# Patient Record
Sex: Female | Born: 1975 | Race: White | Hispanic: No | Marital: Married | Smoking: Former smoker
Health system: Southern US, Community
[De-identification: ages and names within clinical notes are randomized; demographics above are authoritative.]

## PROBLEM LIST (undated history)

## (undated) DIAGNOSIS — M329 Systemic lupus erythematosus, unspecified: Secondary | ICD-10-CM

## (undated) DIAGNOSIS — T8859XA Other complications of anesthesia, initial encounter: Secondary | ICD-10-CM

## (undated) DIAGNOSIS — J301 Allergic rhinitis due to pollen: Secondary | ICD-10-CM

## (undated) DIAGNOSIS — I7300 Raynaud's syndrome without gangrene: Secondary | ICD-10-CM

## (undated) DIAGNOSIS — F329 Major depressive disorder, single episode, unspecified: Secondary | ICD-10-CM

## (undated) DIAGNOSIS — F32A Depression, unspecified: Secondary | ICD-10-CM

## (undated) DIAGNOSIS — B019 Varicella without complication: Secondary | ICD-10-CM

## (undated) DIAGNOSIS — IMO0002 Reserved for concepts with insufficient information to code with codable children: Secondary | ICD-10-CM

## (undated) DIAGNOSIS — F419 Anxiety disorder, unspecified: Secondary | ICD-10-CM

## (undated) DIAGNOSIS — T4145XA Adverse effect of unspecified anesthetic, initial encounter: Secondary | ICD-10-CM

## (undated) HISTORY — DX: Anxiety disorder, unspecified: F41.9

## (undated) HISTORY — DX: Major depressive disorder, single episode, unspecified: F32.9

## (undated) HISTORY — PX: WISDOM TOOTH EXTRACTION: SHX21

## (undated) HISTORY — DX: Varicella without complication: B01.9

## (undated) HISTORY — DX: Allergic rhinitis due to pollen: J30.1

## (undated) HISTORY — DX: Depression, unspecified: F32.A

---
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
## 1999-02-01 HISTORY — PX: APPENDECTOMY: SHX54

## 2006-09-01 ENCOUNTER — Emergency Department (HOSPITAL_COMMUNITY): Admission: EM | Admit: 2006-09-01 | Discharge: 2006-09-01 | Payer: Self-pay | Admitting: Emergency Medicine

## 2012-10-25 ENCOUNTER — Encounter: Payer: Self-pay | Admitting: Internal Medicine

## 2012-10-25 ENCOUNTER — Ambulatory Visit (INDEPENDENT_AMBULATORY_CARE_PROVIDER_SITE_OTHER): Payer: 59 | Admitting: Internal Medicine

## 2012-10-25 VITALS — BP 118/74 | HR 78 | Temp 98.5°F | Wt 237.0 lb

## 2012-10-25 DIAGNOSIS — F341 Dysthymic disorder: Secondary | ICD-10-CM

## 2012-10-25 DIAGNOSIS — F329 Major depressive disorder, single episode, unspecified: Secondary | ICD-10-CM | POA: Insufficient documentation

## 2012-10-25 DIAGNOSIS — R062 Wheezing: Secondary | ICD-10-CM

## 2012-10-25 DIAGNOSIS — R05 Cough: Secondary | ICD-10-CM

## 2012-10-25 MED ORDER — HYDROCODONE-HOMATROPINE 5-1.5 MG/5ML PO SYRP: 5.0000 mL | ORAL_SOLUTION | Freq: Three times a day (TID) | ORAL | Status: DC | PRN

## 2012-10-25 MED ORDER — ESCITALOPRAM OXALATE 10 MG PO TABS: 10.0000 mg | ORAL_TABLET | Freq: Every day | ORAL | Status: DC

## 2012-10-25 MED ORDER — ALBUTEROL SULFATE HFA 108 (90 BASE) MCG/ACT IN AERS: 2.0000 | INHALATION_SPRAY | Freq: Four times a day (QID) | RESPIRATORY_TRACT | Status: DC | PRN

## 2012-10-25 MED ORDER — ALPRAZOLAM 0.25 MG PO TABS: 0.2500 mg | ORAL_TABLET | Freq: Two times a day (BID) | ORAL | Status: DC | PRN

## 2012-10-25 NOTE — Progress Notes (Signed)
Subjective:    Patient ID: Emily Li, female    DOB: 1975-02-12, 37 y.o.   MRN: [id number]  HPI  Pt presents to the clinic today to with c/o difficulty breathing and cough. This occurs mostly at night when she tries to lay down. The cough is unproductive. She denies history of allergies or asthma. She denies fever, chills or body aches. She has not used anything OTC. She has had sick contacts. She was a former smoker, quit 2 months ago. Additionally she c/o intermittent depression and anxiety. This has been a issue for in the past. It is socially related. She is having issues with her partner. She has been on Lexapro and supplemental Xanax in the past which seemed to work well for her. She does not have an appt with her new PCP Dr. Felicity Coyer until March 2015. She has been seeing a counselor who recommends that she get back on her medication. Review of Systems      No past medical history on file.  No current outpatient prescriptions on file.   No current facility-administered medications for this visit.    Not on File  No family history on file.  History   Social History  . Marital Status: Unknown    Spouse Name: N/A    Number of Children: N/A  . Years of Education: N/A   Occupational History  . Not on file.   Social History Main Topics  . Smoking status: Not on file  . Smokeless tobacco: Not on file  . Alcohol Use: Not on file  . Drug Use: Not on file  . Sexual Activity: Not on file   Other Topics Concern  . Not on file   Social History Narrative  . No narrative on file     Constitutional: Denies fever, malaise, fatigue, headache or abrupt weight changes.  HEENT: Denies eye pain, eye redness, ear pain, ringing in the ears, wax buildup, runny nose, nasal congestion, bloody nose, or sore throat. Respiratory: Pt reports cough and difficulty breathing. Denies shortness of breath, or sputum production.   Cardiovascular: Denies chest pain, chest tightness,  palpitations or swelling in the hands or feet.  Psych: Pt reports anxiety and depression. Denies SI/HI.  No other specific complaints in a complete review of systems (except as listed in HPI above).  Objective:   Physical Exam   BP 118/74  Pulse 78  Temp(Src) 98.5 F (36.9 C) (Oral)  Wt 237 lb (107.502 kg)  SpO2 98% Wt Readings from Last 3 Encounters:  10/25/12 237 lb (107.502 kg)    General: Appears her stated age, well developed, well nourished in NAD. HEENT: Head: normal shape and size; Eyes: sclera white, no icterus, conjunctiva pink, PERRLA and EOMs intact; Ears: Tm's gray and intact, normal light reflex; Nose: mucosa pink and moist, septum midline; Throat/Mouth: Teeth present, mucosa pink and moist, no exudate, lesions or ulcerations noted.  Neck: Tonsillar lymphadenopathy noted on right side. Normal range of motion. Neck supple, trachea midline. No thyromegaly present.  Cardiovascular: Normal rate and rhythm. S1,S2 noted.  No murmur, rubs or gallops noted. No JVD or BLE edema. No carotid bruits noted. Pulmonary/Chest: Normal effort and expiratory wheeze noted in RLL. No respiratory distress. No  rales or ronchi noted.  Psychiatric: Mood anxious and affect normal. Behavior is normal. Judgment and thought content normal.         Assessment & Plan:   URI, likely viral:  Supportive measures Tylenol as needed ERx Hycodan for  cough at night only eRx for albuterol for wheezing  RTC as needed or if symptoms persist or worsen

## 2012-10-25 NOTE — Patient Instructions (Signed)

## 2012-10-25 NOTE — Assessment & Plan Note (Signed)
Continue working with your therapist [facility] given eRx for Lexapro 10 mg daily eRx for xanax 0.25 mg BID prn anxiety  RTC in 1 month to assess medication effectiveness

## 2013-02-14 ENCOUNTER — Other Ambulatory Visit: Payer: Self-pay | Admitting: Internal Medicine

## 2013-02-14 NOTE — Telephone Encounter (Signed)
Last filled 10/25/12 w/ 2 refills-- pt has an appt scheduled with Dr Felicity CoyerLeschber 3/15--please advise

## 2013-02-21 ENCOUNTER — Other Ambulatory Visit: Payer: Self-pay | Admitting: Internal Medicine

## 2013-02-21 NOTE — Telephone Encounter (Signed)
Lm on pts vm informing her Rx has been called in to requested pharmacy 

## 2013-02-21 NOTE — Telephone Encounter (Signed)
Please go ahead and phone in xanax

## 2013-02-21 NOTE — Telephone Encounter (Signed)
Last filled 9/14--but pt has an upcoming appt with Dr Rosana FretLeschber--please advise

## 2013-03-19 LAB — HM PAP SMEAR: HM PAP: NORMAL

## 2013-03-27 ENCOUNTER — Encounter: Payer: Self-pay | Admitting: Internal Medicine

## 2013-03-27 ENCOUNTER — Ambulatory Visit (INDEPENDENT_AMBULATORY_CARE_PROVIDER_SITE_OTHER): Payer: 59 | Admitting: Internal Medicine

## 2013-03-27 VITALS — BP 118/72 | HR 74 | Temp 98.3°F | Ht 66.5 in | Wt 240.0 lb

## 2013-03-27 DIAGNOSIS — F329 Major depressive disorder, single episode, unspecified: Secondary | ICD-10-CM

## 2013-03-27 DIAGNOSIS — E669 Obesity, unspecified: Secondary | ICD-10-CM

## 2013-03-27 DIAGNOSIS — F419 Anxiety disorder, unspecified: Secondary | ICD-10-CM

## 2013-03-27 DIAGNOSIS — F341 Dysthymic disorder: Secondary | ICD-10-CM

## 2013-03-27 DIAGNOSIS — E66811 Obesity, class 1: Secondary | ICD-10-CM | POA: Insufficient documentation

## 2013-03-27 DIAGNOSIS — Z0000 Encounter for general adult medical examination without abnormal findings: Secondary | ICD-10-CM

## 2013-03-27 NOTE — Progress Notes (Signed)
Subjective:    Patient ID: Emily Li, female    DOB: 12-25-1975, 38 y.o.   MRN: [id number]  HPI  New patient to me, here to establish care - OV with NP fall 2014 reviewed  patient is here today for annual physical. Patient feels well overall.  Also reviewed chronic medical issues and interval medical events: anxiety/depression, obesity  Past Medical History  Diagnosis Date  . Chicken pox   . Hay fever   . Anxiety   . Anxiety and depression    Family History  Problem Relation Age of Onset  . Alcohol abuse Mother   . Hyperlipidemia Mother   . Diabetes Mother   . Alcohol abuse Father   . Cancer Father   . Hyperlipidemia Father   . Diabetes Father   . Arthritis Maternal Grandmother   . Heart disease Maternal Grandmother   . Stroke Maternal Grandmother   . Hypertension Maternal Grandmother   . Arthritis Maternal Grandfather   . Heart disease Maternal Grandfather   . Stroke Maternal Grandfather   . Hypertension Maternal Grandfather   . Cancer Maternal Aunt 54    vaginal ca   History  Substance Use Topics  . Smoking status: Never Smoker   . Smokeless tobacco: Not on file  . Alcohol Use: Yes    Review of Systems  Constitutional: Negative for fatigue and unexpected weight change.  Respiratory: Negative for cough, shortness of breath and wheezing.   Cardiovascular: Negative for chest pain, palpitations and leg swelling.  Gastrointestinal: Negative for nausea, abdominal pain and diarrhea.  Neurological: Negative for dizziness, weakness, light-headedness and headaches.  Psychiatric/Behavioral: Negative for dysphoric mood. The patient is not nervous/anxious.   All other systems reviewed and are negative.       Objective:   Physical Exam  BP 118/72  Pulse 74  Temp(Src) 98.3 F (36.8 C) (Oral)  Ht 5' 6.5" (1.689 m)  Wt 240 lb (108.863 kg)  BMI 38.16 kg/m2  SpO2 98% Wt Readings from Last 3 Encounters:  03/27/13 240 lb (108.863 kg)  10/25/12 237 lb  (107.502 kg)   Constitutional: She is overweight, but appears well-developed and well-nourished. No distress.  HENT: Head: Normocephalic and atraumatic. Ears: B TMs ok, no erythema or effusion; Nose: Nose normal. Mouth/Throat: Oropharynx is clear and moist. No oropharyngeal exudate.  Eyes: Conjunctivae and EOM are normal. Pupils are equal, round, and reactive to light. No scleral icterus.  Neck: Normal range of motion. Neck supple. No JVD present. No thyromegaly present.  Cardiovascular: Normal rate, regular rhythm and normal heart sounds.  No murmur heard. No BLE edema. Pulmonary/Chest: Effort normal and breath sounds normal. No respiratory distress. She has no wheezes.  Abdominal: Soft. Bowel sounds are normal. She exhibits no distension. There is no tenderness. no masses Musculoskeletal: Normal range of motion, no joint effusions. No gross deformities Neurological: She is alert and oriented to person, place, and time. No cranial nerve deficit. Coordination, balance, strength, speech and gait are normal.  Skin: Skin is warm and dry. No rash noted. No erythema.  Psychiatric: She has a normal mood and affect. Her behavior is normal. Judgment and thought content normal.     No results found for this basename: WBC,  HGB,  HCT,  PLT,  GLUCOSE,  CHOL,  TRIG,  HDL,  LDLDIRECT,  LDLCALC,  ALT,  AST,  NA,  K,  CL,  CREATININE,  BUN,  CO2,  TSH,  PSA,  INR,  GLUF,  HGBA1C,  MICROALBUR    No results found.     Assessment & Plan:   CPX/v70.0 - Patient has been counseled on age-appropriate routine health concerns for screening and prevention. These are reviewed and up-to-date. Immunizations are up-to-date or declined. Labs ordered/ reviewed.  Problem List Items Addressed This Visit   Anxiety and depression     symptoms stable on current lexapro and prn xanax The current medical regimen is effective;  continue present plan and medications.     Obese      Wt Readings from Last 3 Encounters:    03/27/13 240 lb (108.863 kg)  10/25/12 237 lb (107.502 kg)   The patient is asked to make an attempt to improve diet and exercise patterns to aid in medical management of this problem. Continue working with [employer] as ongoing since 2013     Other Visit Diagnoses   Routine general medical examination at a health care facility    -  Primary    Relevant Orders       Basic metabolic panel       CBC with Differential       Hepatic function panel       Lipid panel       TSH       Urinalysis, Routine w reflex microscopic

## 2013-03-27 NOTE — Patient Instructions (Addendum)
It was good to see you today.  We have reviewed your prior records including labs and tests today  Health Maintenance reviewed - all recommended immunizations and age-appropriate screenings are up-to-date.  Test(s) ordered today. Your results will be released to [facility] (or called to you) after review, usually within 72hours after test completion. If any changes need to be made, you will be notified at that same time.  Medications reviewed and updated, no changes recommended at this time.  Please schedule followup in 12 months for annual exam and labs, call sooner if problems.  Health Maintenance, Female A healthy lifestyle and preventative care can promote health and wellness.  Maintain regular health, dental, and eye exams.  Eat a healthy diet. Foods like vegetables, fruits, whole grains, low-fat dairy products, and lean protein foods contain the nutrients you need without too many calories. Decrease your intake of foods high in solid fats, added sugars, and salt. Get information about a proper diet from your caregiver, if necessary.  Regular physical exercise is one of the most important things you can do for your health. Most adults should get at least 150 minutes of moderate-intensity exercise (any activity that increases your heart rate and causes you to sweat) each week. In addition, most adults need muscle-strengthening exercises on 2 or more days a week.   Maintain a healthy weight. The body mass index (BMI) is a screening tool to identify possible weight problems. It provides an estimate of body fat based on height and weight. Your caregiver can help determine your BMI, and can help you achieve or maintain a healthy weight. For adults 20 years and older:  A BMI below 18.5 is considered underweight.  A BMI of 18.5 to 24.9 is normal.  A BMI of 25 to 29.9 is considered overweight.  A BMI of 30 and above is considered obese.  Maintain normal blood lipids and cholesterol by  exercising and minimizing your intake of saturated fat. Eat a balanced diet with plenty of fruits and vegetables. Blood tests for lipids and cholesterol should begin at age 96 and be repeated every 5 years. If your lipid or cholesterol levels are high, you are over 50, or you are a high risk for heart disease, you may need your cholesterol levels checked more frequently.Ongoing high lipid and cholesterol levels should be treated with medicines if diet and exercise are not effective.  If you smoke, find out from your caregiver how to quit. If you do not use tobacco, do not start.  Lung cancer screening is recommended for adults aged 32 80 years who are at high risk for developing lung cancer because of a history of smoking. Yearly low-dose computed tomography (CT) is recommended for people who have at least a 30-pack-year history of smoking and are a current smoker or have quit within the past 15 years. A pack year of smoking is smoking an average of 1 pack of cigarettes a day for 1 year (for example: 1 pack a day for 30 years or 2 packs a day for 15 years). Yearly screening should continue until the smoker has stopped smoking for at least 15 years. Yearly screening should also be stopped for people who develop a health problem that would prevent them from having lung cancer treatment.  If you are pregnant, do not drink alcohol. If you are breastfeeding, be very cautious about drinking alcohol. If you are not pregnant and choose to drink alcohol, do not exceed 1 drink per day. One drink  is considered to be 12 ounces (355 mL) of beer, 5 ounces (148 mL) of wine, or 1.5 ounces (44 mL) of liquor.  Avoid use of street drugs. Do not share needles with anyone. Ask for help if you need support or instructions about stopping the use of drugs.  High blood pressure causes heart disease and increases the risk of stroke. Blood pressure should be checked at least every 1 to 2 years. Ongoing high blood pressure should be  treated with medicines, if weight loss and exercise are not effective.  If you are 39 to 36 years old, ask your caregiver if you should take aspirin to prevent strokes.  Diabetes screening involves taking a blood sample to check your fasting blood sugar level. This should be done once every 3 years, after age 66, if you are within normal weight and without risk factors for diabetes. Testing should be considered at a younger age or be carried out more frequently if you are overweight and have at least 1 risk factor for diabetes.  Breast cancer screening is essential preventative care for women. You should practice "breast self-awareness." This means understanding the normal appearance and feel of your breasts and may include breast self-examination. Any changes detected, no matter how small, should be reported to a caregiver. Women in their 73s and 30s should have a clinical breast exam (CBE) by a caregiver as part of a regular health exam every 1 to 3 years. After age 39, women should have a CBE every year. Starting at age 91, women should consider having a mammogram (breast X-ray) every year. Women who have a family history of breast cancer should talk to their caregiver about genetic screening. Women at a high risk of breast cancer should talk to their caregiver about having an MRI and a mammogram every year.  Breast cancer gene (BRCA)-related cancer risk assessment is recommended for women who have family members with BRCA-related cancers. BRCA-related cancers include breast, ovarian, tubal, and peritoneal cancers. Having family members with these cancers may be associated with an increased risk for harmful changes (mutations) in the breast cancer genes BRCA1 and BRCA2. Results of the assessment will determine the need for genetic counseling and BRCA1 and BRCA2 testing.  The Pap test is a screening test for cervical cancer. Women should have a Pap test starting at age 63. Between ages 85 and 56, Pap  tests should be repeated every 2 years. Beginning at age 29, you should have a Pap test every 3 years as long as the past 3 Pap tests have been normal. If you had a hysterectomy for a problem that was not cancer or a condition that could lead to cancer, then you no longer need Pap tests. If you are between ages 49 and 47, and you have had normal Pap tests going back 10 years, you no longer need Pap tests. If you have had past treatment for cervical cancer or a condition that could lead to cancer, you need Pap tests and screening for cancer for at least 20 years after your treatment. If Pap tests have been discontinued, risk factors (such as a new sexual partner) need to be reassessed to determine if screening should be resumed. Some women have medical problems that increase the chance of getting cervical cancer. In these cases, your caregiver may recommend more frequent screening and Pap tests.  The human papillomavirus (HPV) test is an additional test that may be used for cervical cancer screening. The HPV test looks for  the virus that can cause the cell changes on the cervix. The cells collected during the Pap test can be tested for HPV. The HPV test could be used to screen women aged 31 years and older, and should be used in women of any age who have unclear Pap test results. After the age of 35, women should have HPV testing at the same frequency as a Pap test.  Colorectal cancer can be detected and often prevented. Most routine colorectal cancer screening begins at the age of 76 and continues through age 69. However, your caregiver may recommend screening at an earlier age if you have risk factors for colon cancer. On a yearly basis, your caregiver may provide home test kits to check for hidden blood in the stool. Use of a small camera at the end of a tube, to directly examine the colon (sigmoidoscopy or colonoscopy), can detect the earliest forms of colorectal cancer. Talk to your caregiver about this at  age 68, when routine screening begins. Direct examination of the colon should be repeated every 5 to 10 years through age 84, unless early forms of pre-cancerous polyps or small growths are found.  Hepatitis C blood testing is recommended for all people born from [birth year] through 1963 and any individual with known risks for hepatitis C.  Practice safe sex. Use condoms and avoid high-risk sexual practices to reduce the spread of sexually transmitted infections (STIs). Sexually active women aged 66 and younger should be checked for Chlamydia, which is a common sexually transmitted infection. Older women with new or multiple partners should also be tested for Chlamydia. Testing for other STIs is recommended if you are sexually active and at increased risk.  Osteoporosis is a disease in which the bones lose minerals and strength with aging. This can result in serious bone fractures. The risk of osteoporosis can be identified using a bone density scan. Women ages 65 and over and women at risk for fractures or osteoporosis should discuss screening with their caregivers. Ask your caregiver whether you should be taking a calcium supplement or vitamin D to reduce the rate of osteoporosis.  Menopause can be associated with physical symptoms and risks. Hormone replacement therapy is available to decrease symptoms and risks. You should talk to your caregiver about whether hormone replacement therapy is right for you.  Use sunscreen. Apply sunscreen liberally and repeatedly throughout the day. You should seek shade when your shadow is shorter than you. Protect yourself by wearing long sleeves, pants, a wide-brimmed hat, and sunglasses year round, whenever you are outdoors.  Notify your caregiver of new moles or changes in moles, especially if there is a change in shape or color. Also notify your caregiver if a mole is larger than the size of a pencil eraser.  Stay current with your immunizations. Document Released:  08/02/2010 Document Revised: 05/14/2012 Document Reviewed: 08/02/2010 [facility] Patient Information 2014 [place].

## 2013-03-27 NOTE — Progress Notes (Signed)
Pre-visit discussion using our clinic review tool. No additional management support is needed unless otherwise documented below in the visit note.  

## 2013-03-27 NOTE — Assessment & Plan Note (Signed)
Wt Readings from Last 3 Encounters:  03/27/13 240 lb (108.863 kg)  10/25/12 237 lb (107.502 kg)   The patient is asked to make an attempt to improve diet and exercise patterns to aid in medical management of this problem. Continue working with [employer] as ongoing since 2013

## 2013-03-27 NOTE — Assessment & Plan Note (Signed)
symptoms stable on current lexapro and prn xanax The current medical regimen is effective;  continue present plan and medications.

## 2013-04-02 ENCOUNTER — Ambulatory Visit: Payer: Self-pay | Admitting: Internal Medicine

## 2013-07-08 ENCOUNTER — Other Ambulatory Visit: Payer: Self-pay | Admitting: Internal Medicine

## 2013-07-08 NOTE — Telephone Encounter (Signed)
Pt had a

## 2013-07-08 NOTE — Telephone Encounter (Signed)
Pt had a CPE appointment with you on 03/27/13--please advise Rx last filled by Nicki Reaper on 02/14/13 with 2 refills

## 2014-01-14 ENCOUNTER — Other Ambulatory Visit: Payer: Self-pay | Admitting: Internal Medicine

## 2014-01-20 ENCOUNTER — Encounter (HOSPITAL_COMMUNITY): Payer: Self-pay | Admitting: Emergency Medicine

## 2014-01-20 ENCOUNTER — Emergency Department (HOSPITAL_COMMUNITY)
Admission: EM | Admit: 2014-01-20 | Discharge: 2014-01-20 | Disposition: A | Discharge status: Home / Self Care | Payer: 59 | Source: Home / Self Care | Attending: Emergency Medicine | Admitting: Emergency Medicine

## 2014-01-20 DIAGNOSIS — J039 Acute tonsillitis, unspecified: Secondary | ICD-10-CM

## 2014-01-20 MED ORDER — ONDANSETRON 8 MG PO TBDP: 8.0000 mg | ORAL_TABLET | Freq: Three times a day (TID) | ORAL | Status: DC | PRN

## 2014-01-20 MED ORDER — AMOXICILLIN 500 MG PO CAPS: 1000.0000 mg | ORAL_CAPSULE | Freq: Three times a day (TID) | ORAL | Status: DC

## 2014-01-20 MED ORDER — PREDNISONE 50 MG PO TABS: 50.0000 mg | ORAL_TABLET | Freq: Every day | ORAL | Status: DC

## 2014-01-20 NOTE — ED Provider Notes (Signed)
CSN: [id number]     Arrival date & time 01/20/14  1100 History   First MD Initiated Contact with Patient 01/20/14 1144     Chief Complaint  Patient presents with  . Sore Throat  . Fever  . Emesis   (Consider location/radiation/quality/duration/timing/severity/associated sxs/prior Treatment) HPI           38 year old female presents complaining of sore throat. For 2 days she has had sore throat, vomiting, fever to 104F, and body aches. She has multiple sick contacts, she works in the pharmacy. No recent travel. Over-the-counter medications are not helping her symptoms.    Past Medical History  Diagnosis Date  . Chicken pox   . Hay fever   . Anxiety   . Anxiety and depression    Past Surgical History  Procedure Laterality Date  . Appendectomy  2001  . Wisdom tooth extraction     Family History  Problem Relation Age of Onset  . Alcohol abuse Mother   . Hyperlipidemia Mother   . Diabetes Mother   . Alcohol abuse Father   . Cancer Father   . Hyperlipidemia Father   . Diabetes Father   . Arthritis Maternal Grandmother   . Heart disease Maternal Grandmother   . Stroke Maternal Grandmother   . Hypertension Maternal Grandmother   . Arthritis Maternal Grandfather   . Heart disease Maternal Grandfather   . Stroke Maternal Grandfather   . Hypertension Maternal Grandfather   . Cancer Maternal Aunt 54    vaginal ca   History  Substance Use Topics  . Smoking status: Never Smoker   . Smokeless tobacco: Not on file  . Alcohol Use: Yes   OB History    No data available     Review of Systems  Constitutional: Positive for fever and chills.  HENT: Positive for sore throat. Negative for congestion and ear pain.   Respiratory: Negative for cough.   Gastrointestinal: Positive for nausea and vomiting. Negative for abdominal pain and diarrhea.  Musculoskeletal: Positive for myalgias and arthralgias.  All other systems reviewed and are negative.   Allergies  Anectine and  Latex  Home Medications   Prior to Admission medications   Medication Sig Start Date End Date Taking? Authorizing Provider  ALPRAZolam Prudy Feeler(XANAX) 0.25 MG tablet TAKE 1 TABLET BY MOUTH TWICE DAILY AS NEEDED 02/21/13   Lorre Munroeegina W Baity, NP  amoxicillin (AMOXIL) 500 MG capsule Take 2 capsules (1,000 mg total) by mouth 3 (three) times daily. 01/20/14   Graylon GoodZachary H Amador Braddy, PA-C  escitalopram (LEXAPRO) 10 MG tablet TAKE 1 TABLET BY MOUTH ONCE DAILY 01/14/14   Newt LukesValerie A Leschber, MD  ondansetron (ZOFRAN ODT) 8 MG disintegrating tablet Take 1 tablet (8 mg total) by mouth every 8 (eight) hours as needed for nausea or vomiting. 01/20/14   Graylon GoodZachary H Sedona Wenk, PA-C  predniSONE (DELTASONE) 50 MG tablet Take 1 tablet (50 mg total) by mouth daily with breakfast. 01/20/14   Graylon GoodZachary H Marrietta Thunder, PA-C   BP 125/83 mmHg  Pulse 108  Temp(Src) 100.8 F (38.2 C) (Oral)  SpO2 97%  LMP 12/30/2013 (Approximate) Physical Exam  Constitutional: She is oriented to person, place, and time. Vital signs are normal. She appears well-developed and well-nourished. No distress.  HENT:  Head: Normocephalic and atraumatic.  Right Ear: External ear normal.  Left Ear: External ear normal.  Nose: Nose normal.  Mouth/Throat: Uvula is midline and mucous membranes are normal. Oropharyngeal exudate and posterior oropharyngeal erythema present. No posterior oropharyngeal edema or  tonsillar abscesses.  3+ tonsillar enlargement with erythema and exudate bilaterally, symmetrical enlargement  Eyes: Conjunctivae are normal. Right eye exhibits no discharge. Left eye exhibits no discharge.  Neck: Normal range of motion. Neck supple.  Cardiovascular: Tachycardia present.   Pulmonary/Chest: Effort normal. No respiratory distress.  Lymphadenopathy:       Head (right side): Tonsillar adenopathy present.       Head (left side): Tonsillar adenopathy present.    She has cervical adenopathy.       Right cervical: Posterior cervical adenopathy present.        Left cervical: Posterior cervical adenopathy present.  Neurological: She is alert and oriented to person, place, and time. She has normal strength. Coordination normal.  Skin: Skin is warm and dry. No rash noted. She is not diaphoretic.  Psychiatric: She has a normal mood and affect. Judgment normal.  Nursing note and vitals reviewed.   ED Course  Procedures (including critical care time) Labs Review Labs Reviewed - No data to display  Imaging Review No results found.   MDM   1. Exudative tonsillitis    Consistent with strep tonsillitis. Treat with amoxicillin and symptomatic management. Follow-up when necessary if no improvement in a few days   Meds ordered this encounter  Medications  . amoxicillin (AMOXIL) 500 MG capsule    Sig: Take 2 capsules (1,000 mg total) by mouth 3 (three) times daily.    Dispense:  42 capsule    Refill:  0  . ondansetron (ZOFRAN ODT) 8 MG disintegrating tablet    Sig: Take 1 tablet (8 mg total) by mouth every 8 (eight) hours as needed for nausea or vomiting.    Dispense:  12 tablet    Refill:  0  . predniSONE (DELTASONE) 50 MG tablet    Sig: Take 1 tablet (50 mg total) by mouth daily with breakfast.    Dispense:  5 tablet    Refill:  0       Graylon GoodZachary H Olamae Ferrara, PA-C 01/20/14 1147

## 2014-01-20 NOTE — Discharge Instructions (Signed)

## 2014-01-20 NOTE — ED Notes (Signed)
Pt started with vomiting, fever, and body aches yesterday.  She developed a fever of 104 last night.  She also complains of a sore throat.

## 2014-08-07 ENCOUNTER — Ambulatory Visit (INDEPENDENT_AMBULATORY_CARE_PROVIDER_SITE_OTHER): Payer: 59 | Admitting: Internal Medicine

## 2014-08-07 ENCOUNTER — Other Ambulatory Visit (INDEPENDENT_AMBULATORY_CARE_PROVIDER_SITE_OTHER): Payer: 59

## 2014-08-07 ENCOUNTER — Encounter: Payer: Self-pay | Admitting: Internal Medicine

## 2014-08-07 VITALS — BP 108/78 | HR 81 | Temp 97.8°F | Ht 66.5 in | Wt 256.5 lb

## 2014-08-07 DIAGNOSIS — Z0000 Encounter for general adult medical examination without abnormal findings: Secondary | ICD-10-CM

## 2014-08-07 LAB — COMPREHENSIVE METABOLIC PANEL
ALT: 11 U/L (ref 0–35)
AST: 12 U/L (ref 0–37)
Albumin: 3.5 g/dL (ref 3.5–5.2)
Alkaline Phosphatase: 44 U/L (ref 39–117)
BUN: 9 mg/dL (ref 6–23)
CHLORIDE: 108 meq/L (ref 96–112)
CO2: 27 meq/L (ref 19–32)
Calcium: 9.1 mg/dL (ref 8.4–10.5)
Creatinine, Ser: 0.78 mg/dL (ref 0.40–1.20)
GFR: 88.44 mL/min (ref >60.00)
Glucose, Bld: 87 mg/dL (ref 70–99)
Potassium: 4.4 mEq/L (ref 3.5–5.1)
Sodium: 141 mEq/L (ref 135–145)
TOTAL PROTEIN: 6.6 g/dL (ref 6.0–8.3)
Total Bilirubin: 0.4 mg/dL (ref 0.2–1.2)

## 2014-08-07 LAB — LIPID PANEL
CHOLESTEROL: 239 mg/dL — AB (ref 0–200)
HDL: 59.2 mg/dL (ref >39.00)
LDL Cholesterol: 142 mg/dL — ABNORMAL HIGH (ref 0–99)
NonHDL: 179.8
Total CHOL/HDL Ratio: 4
Triglycerides: 189 mg/dL — ABNORMAL HIGH (ref 0.0–149.0)
VLDL: 37.8 mg/dL (ref 0.0–40.0)

## 2014-08-07 LAB — CBC
HCT: 42.2 % (ref 36.0–46.0)
Hemoglobin: 14.1 g/dL (ref 12.0–15.0)
MCHC: 33.3 g/dL (ref 30.0–36.0)
MCV: 91.3 fl (ref 78.0–100.0)
PLATELETS: 207 10*3/uL (ref 150.0–400.0)
RBC: 4.62 Mil/uL (ref 3.87–5.11)
RDW: 13.1 % (ref 11.5–15.5)
WBC: 6.7 10*3/uL (ref 4.0–10.5)

## 2014-08-07 LAB — HEPATITIS B SURFACE ANTIBODY,QUALITATIVE: Hep B S Ab: POSITIVE — AB

## 2014-08-07 LAB — HEMOGLOBIN A1C: HEMOGLOBIN A1C: 5.1 % (ref 4.6–6.5)

## 2014-08-07 LAB — TSH: TSH: 1.73 u[IU]/mL (ref 0.35–4.50)

## 2014-08-07 MED ORDER — ESCITALOPRAM OXALATE 10 MG PO TABS: 10.0000 mg | ORAL_TABLET | Freq: Every day | ORAL | Status: DC

## 2014-08-07 NOTE — Assessment & Plan Note (Signed)
Checking for complications with CMP, lipid panel, HgA1c. Is exercising and trying to lose weight. Feels her birth control is making her gain weight and advised her to talk to her Ob/gyn about that since she could not remember name of it today.

## 2014-08-07 NOTE — Assessment & Plan Note (Signed)
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 She needs TB skin test times 2. She will come early next week for the first then wait 2 weeks for next. If not immune will need series again since she does not have any of her shot dates. Checking routine labs. Is exercising and non-smoker. Talked to her about weight loss as a way to a healthier life. She is working on it.

## 2014-08-07 NOTE — Progress Notes (Signed)
   Subjective:    Patient ID: Emily Li, female    DOB: 04/15/1975, 39 y.o.   MRN: [id number]  HPI The patient is a 39 YO female coming in for wellness. Also needs for titers for school. No new complaints.   [facility] reviewed and updated.  Review of Systems  Constitutional: Negative for fever, activity change, appetite change, fatigue and unexpected weight change.  HENT: Negative.   Eyes: Negative.   Respiratory: Negative for cough, chest tightness, shortness of breath and wheezing.   Cardiovascular: Negative for chest pain, palpitations and leg swelling.  Gastrointestinal: Negative for nausea, abdominal pain, diarrhea and abdominal distention.  Musculoskeletal: Negative.   Skin: Negative.   Neurological: Negative.   Psychiatric/Behavioral: Negative.       Objective:   Physical Exam  Constitutional: She is oriented to person, place, and time. She appears well-developed and well-nourished.  Overweight  HENT:  Head: Normocephalic and atraumatic.  Eyes: EOM are normal.  Neck: Normal range of motion.  Cardiovascular: Normal rate and regular rhythm.   Pulmonary/Chest: Effort normal. No respiratory distress. She has no wheezes. She has no rales.  Abdominal: Soft. She exhibits no distension. There is no tenderness. There is no rebound.  Musculoskeletal: She exhibits no edema.  Neurological: She is alert and oriented to person, place, and time. Coordination normal.  Skin: Skin is warm and dry.  Psychiatric: She has a normal mood and affect.   Filed Vitals:   08/07/14 0851  BP: 108/78  Pulse: 81  Temp: 97.8 F (36.6 C)  TempSrc: Oral  Height: 5' 6.5" (1.689 m)  Weight: 256 lb 8 oz (116.348 kg)  SpO2: 97%      Assessment & Plan:

## 2014-08-07 NOTE — Progress Notes (Signed)
Pre visit review using our clinic review tool, if applicable. No additional management support is needed unless otherwise documented below in the visit note. 

## 2014-08-07 NOTE — Patient Instructions (Signed)
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 If any of these come back not immune you will need to undergo the vaccination series again.   We are also checking your basic labs to make sure you are still healthy.   We will keep your form here since you need to do 2 TB skin tests and that way we can attach a copy of the titers before I sign them.   I would strongly recommend keeping a copy of all your titers for the future.   Health Maintenance Adopting a healthy lifestyle and getting preventive care can go a long way to promote health and wellness. Talk with your health care provider about what schedule of regular examinations is right for you. This is a good chance for you to check in with your provider about disease prevention and staying healthy. In between checkups, there are plenty of things you can do on your own. Experts have done a lot of research about which lifestyle changes and preventive measures are most likely to keep you healthy. Ask your health care provider for more information. WEIGHT AND DIET  Eat a healthy diet  Be sure to include plenty of vegetables, fruits, low-fat dairy products, and lean protein.  Do not eat a lot of foods high in solid fats, added sugars, or salt.  Get regular exercise. This is one of the most important things you can do for your health.  Most adults should exercise for at least 150 minutes each week. The exercise should increase your heart rate and make you sweat (moderate-intensity exercise).  Most adults should also do strengthening exercises at least twice a week. This is in addition to the moderate-intensity exercise.  Maintain a healthy weight  Body mass index (BMI) is a measurement that can be used to identify possible weight problems. It estimates body fat based on height and weight. Your health care provider can help determine your BMI and help you achieve or maintain a healthy weight.  For females 71 years of age and older:   A  BMI below 18.5 is considered underweight.  A BMI of 18.5 to 24.9 is normal.  A BMI of 25 to 29.9 is considered overweight.  A BMI of 30 and above is considered obese.  Watch levels of cholesterol and blood lipids  You should start having your blood tested for lipids and cholesterol at 37 years of age, then have this test every 5 years.  You may need to have your cholesterol levels checked more often if:  Your lipid or cholesterol levels are high.  You are older than 37 years of age.  You are at high risk for heart disease.  CANCER SCREENING   Lung Cancer  Lung cancer screening is recommended for adults 27-52 years old who are at high risk for lung cancer because of a history of smoking.  A yearly low-dose CT scan of the lungs is recommended for people who:  Currently smoke.  Have quit within the past 15 years.  Have at least a 30-pack-year history of smoking. A pack year is smoking an average of one pack of cigarettes a day for 1 year.  Yearly screening should continue until it has been 15 years since you quit.  Yearly screening should stop if you develop a health problem that would prevent you from having lung cancer treatment.  Breast Cancer  Practice breast self-awareness. This means understanding how your breasts normally appear and feel.  It also means doing  regular breast self-exams. Let your health care provider know about any changes, no matter how small.  If you are in your 20s or 30s, you should have a clinical breast exam (CBE) by a health care provider every 1-3 years as part of a regular health exam.  If you are 11 or older, have a CBE every year. Also consider having a breast X-ray (mammogram) every year.  If you have a family history of breast cancer, talk to your health care provider about genetic screening.  If you are at high risk for breast cancer, talk to your health care provider about having an MRI and a mammogram every year.  Breast cancer  gene (BRCA) assessment is recommended for women who have family members with BRCA-related cancers. BRCA-related cancers include:  Breast.  Ovarian.  Tubal.  Peritoneal cancers.  Results of the assessment will determine the need for genetic counseling and BRCA1 and BRCA2 testing. Cervical Cancer Routine pelvic examinations to screen for cervical cancer are no longer recommended for nonpregnant women who are considered low risk for cancer of the pelvic organs (ovaries, uterus, and vagina) and who do not have symptoms. A pelvic examination may be necessary if you have symptoms including those associated with pelvic infections. Ask your health care provider if a screening pelvic exam is right for you.   The Pap test is the screening test for cervical cancer for women who are considered at risk.  If you had a hysterectomy for a problem that was not cancer or a condition that could lead to cancer, then you no longer need Pap tests.  If you are older than 65 years, and you have had normal Pap tests for the past 10 years, you no longer need to have Pap tests.  If you have had past treatment for cervical cancer or a condition that could lead to cancer, you need Pap tests and screening for cancer for at least 20 years after your treatment.  If you no longer get a Pap test, assess your risk factors if they change (such as having a new sexual partner). This can affect whether you should start being screened again.  Some women have medical problems that increase their chance of getting cervical cancer. If this is the case for you, your health care provider may recommend more frequent screening and Pap tests.  The human papillomavirus (HPV) test is another test that may be used for cervical cancer screening. The HPV test looks for the virus that can cause cell changes in the cervix. The cells collected during the Pap test can be tested for HPV.  The HPV test can be used to screen women 51 years of age  and older. Getting tested for HPV can extend the interval between normal Pap tests from three to five years.  An HPV test also should be used to screen women of any age who have unclear Pap test results.  After 37 years of age, women should have HPV testing as often as Pap tests.  Colorectal Cancer  This type of cancer can be detected and often prevented.  Routine colorectal cancer screening usually begins at 37 years of age and continues through 37 years of age.  Your health care provider may recommend screening at an earlier age if you have risk factors for colon cancer.  Your health care provider may also recommend using home test kits to check for hidden blood in the stool.  A small camera at the end of a tube  can be used to examine your colon directly (sigmoidoscopy or colonoscopy). This is done to check for the earliest forms of colorectal cancer.  Routine screening usually begins at age 5.  Direct examination of the colon should be repeated every 5-10 years through 37 years of age. However, you may need to be screened more often if early forms of precancerous polyps or small growths are found. Skin Cancer  Check your skin from head to toe regularly.  Tell your health care provider about any new moles or changes in moles, especially if there is a change in a mole's shape or color.  Also tell your health care provider if you have a mole that is larger than the size of a pencil eraser.  Always use sunscreen. Apply sunscreen liberally and repeatedly throughout the day.  Protect yourself by wearing long sleeves, pants, a wide-brimmed hat, and sunglasses whenever you are outside. HEART DISEASE, DIABETES, AND HIGH BLOOD PRESSURE   Have your blood pressure checked at least every 1-2 years. High blood pressure causes heart disease and increases the risk of stroke.  If you are between 41 years and 63 years old, ask your health care provider if you should take aspirin to prevent  strokes.  Have regular diabetes screenings. This involves taking a blood sample to check your fasting blood sugar level.  If you are at a normal weight and have a low risk for diabetes, have this test once every three years after 37 years of age.  If you are overweight and have a high risk for diabetes, consider being tested at a younger age or more often. PREVENTING INFECTION  Hepatitis B  If you have a higher risk for hepatitis B, you should be screened for this virus. You are considered at high risk for hepatitis B if:  You were born in a country where hepatitis B is common. Ask your health care provider which countries are considered high risk.  Your parents were born in a high-risk country, and you have not been immunized against hepatitis B (hepatitis B vaccine).  You have HIV or AIDS.  You use needles to inject street drugs.  You live with someone who has hepatitis B.  You have had sex with someone who has hepatitis B.  You get hemodialysis treatment.  You take certain medicines for conditions, including cancer, organ transplantation, and autoimmune conditions. Hepatitis C  Blood testing is recommended for:  Everyone born from [birth year] through 1963.  Anyone with known risk factors for hepatitis C. Sexually transmitted infections (STIs)  You should be screened for sexually transmitted infections (STIs) including gonorrhea and chlamydia if:  You are sexually active and are younger than 37 years of age.  You are older than 37 years of age and your health care provider tells you that you are at risk for this type of infection.  Your sexual activity has changed since you were last screened and you are at an increased risk for chlamydia or gonorrhea. Ask your health care provider if you are at risk.  If you do not have HIV, but are at risk, it may be recommended that you take a prescription medicine daily to prevent HIV infection. This is called pre-exposure prophylaxis  (PrEP). You are considered at risk if:  You are sexually active and do not regularly use condoms or know the HIV status of your partner(s).  You take drugs by injection.  You are sexually active with a partner who has HIV. Talk with your  health care provider about whether you are at high risk of being infected with HIV. If you choose to begin PrEP, you should first be tested for HIV. You should then be tested every 3 months for as long as you are taking PrEP.  PREGNANCY   If you are premenopausal and you may become pregnant, ask your health care provider about preconception counseling.  If you may become pregnant, take 400 to 800 micrograms (mcg) of folic acid every day.  If you want to prevent pregnancy, talk to your health care provider about birth control (contraception). OSTEOPOROSIS AND MENOPAUSE   Osteoporosis is a disease in which the bones lose minerals and strength with aging. This can result in serious bone fractures. Your risk for osteoporosis can be identified using a bone density scan.  If you are 64 years of age or older, or if you are at risk for osteoporosis and fractures, ask your health care provider if you should be screened.  Ask your health care provider whether you should take a calcium or vitamin D supplement to lower your risk for osteoporosis.  Menopause may have certain physical symptoms and risks.  Hormone replacement therapy may reduce some of these symptoms and risks. Talk to your health care provider about whether hormone replacement therapy is right for you.  HOME CARE INSTRUCTIONS   Schedule regular health, dental, and eye exams.  Stay current with your immunizations.   Do not use any tobacco products including cigarettes, chewing tobacco, or electronic cigarettes.  If you are pregnant, do not drink alcohol.  If you are breastfeeding, limit how much and how often you drink alcohol.  Limit alcohol intake to no more than 1 drink per day for  nonpregnant women. One drink equals 12 ounces of beer, 5 ounces of wine, or 1 ounces of hard liquor.  Do not use street drugs.  Do not share needles.  Ask your health care provider for help if you need support or information about quitting drugs.  Tell your health care provider if you often feel depressed.  Tell your health care provider if you have ever been abused or do not feel safe at home. Document Released: 08/02/2010 Document Revised: 06/03/2013 Document Reviewed: 12/19/2012 [facility] Patient Information 2015 [place], [place]. This information is not intended to replace advice given to you by your health care provider. Make sure you discuss any questions you have with your health care provider.

## 2014-08-08 DIAGNOSIS — Z0279 Encounter for issue of other medical certificate: Secondary | ICD-10-CM

## 2014-08-08 LAB — VARICELLA ZOSTER ANTIBODY, IGG: VARICELLA IGG: 548.4 {index} — AB (ref <135.00)

## 2014-08-08 LAB — MEASLES/MUMPS/RUBELLA IMMUNITY
Mumps IgG: 22.7 AU/mL — ABNORMAL HIGH (ref <9.00)
Rubella: 1.11 Index — ABNORMAL HIGH (ref <0.90)
Rubeola IgG: 236 AU/mL — ABNORMAL HIGH (ref <25.00)

## 2014-08-13 ENCOUNTER — Ambulatory Visit (INDEPENDENT_AMBULATORY_CARE_PROVIDER_SITE_OTHER): Payer: 59

## 2014-08-13 DIAGNOSIS — Z111 Encounter for screening for respiratory tuberculosis: Secondary | ICD-10-CM

## 2014-08-15 LAB — TB SKIN TEST
INDURATION: 0 mm
TB SKIN TEST: NEGATIVE

## 2014-09-02 ENCOUNTER — Ambulatory Visit (INDEPENDENT_AMBULATORY_CARE_PROVIDER_SITE_OTHER): Payer: 59

## 2014-09-02 DIAGNOSIS — Z111 Encounter for screening for respiratory tuberculosis: Secondary | ICD-10-CM | POA: Diagnosis not present

## 2014-09-04 ENCOUNTER — Ambulatory Visit: Payer: 59

## 2014-09-04 LAB — TB SKIN TEST
Induration: 0 mm
TB Skin Test: NEGATIVE

## 2015-02-19 MED FILL — TRI-LO-MARZIA TABLET: 0.18/0.215/ | 56 days supply | Qty: 56 | Fill #5

## 2015-02-19 MED FILL — ESCITALOPRAM 10 MG TABLET: 10 | 90 days supply | Qty: 90 | Fill #2

## 2015-03-11 ENCOUNTER — Other Ambulatory Visit: Payer: Self-pay | Admitting: Internal Medicine

## 2015-03-12 NOTE — Telephone Encounter (Signed)
Last filled by Nicki Reaper 2015--please advise as last OV was with you for CPE

## 2015-03-17 MED FILL — TRI-LO-MARZIA TABLET: 0.18/0.215/ | 28 days supply | Qty: 28 | Fill #0

## 2015-03-23 ENCOUNTER — Ambulatory Visit (INDEPENDENT_AMBULATORY_CARE_PROVIDER_SITE_OTHER): Payer: 59 | Admitting: Internal Medicine

## 2015-03-23 ENCOUNTER — Encounter: Payer: Self-pay | Admitting: Internal Medicine

## 2015-03-23 ENCOUNTER — Other Ambulatory Visit (INDEPENDENT_AMBULATORY_CARE_PROVIDER_SITE_OTHER): Payer: 59

## 2015-03-23 VITALS — BP 102/62 | HR 77 | Temp 98.0°F | Resp 16 | Ht 67.0 in | Wt 271.0 lb

## 2015-03-23 DIAGNOSIS — R1012 Left upper quadrant pain: Secondary | ICD-10-CM

## 2015-03-23 LAB — CBC
HCT: 42.1 % (ref 36.0–46.0)
Hemoglobin: 14.2 g/dL (ref 12.0–15.0)
MCHC: 33.8 g/dL (ref 30.0–36.0)
MCV: 89.7 fl (ref 78.0–100.0)
Platelets: 216 10*3/uL (ref 150.0–400.0)
RBC: 4.69 Mil/uL (ref 3.87–5.11)
RDW: 13 % (ref 11.5–15.5)
WBC: 6.4 10*3/uL (ref 4.0–10.5)

## 2015-03-23 LAB — LIPASE: LIPASE: 24 U/L (ref 11.0–59.0)

## 2015-03-23 MED ORDER — ALPRAZOLAM 0.25 MG PO TABS: 0.2500 mg | ORAL_TABLET | Freq: Two times a day (BID) | ORAL | Status: DC | PRN

## 2015-03-23 NOTE — Patient Instructions (Signed)
I would recommend to decrease the fiber in your diet to see if this takes away the pain.   If you are still having this problem in the next several weeks to month call us back and we can check an imaging test of the stomach.   We are checking the blood counts today to make sure there are no signs of infection.

## 2015-03-23 NOTE — Progress Notes (Signed)
   Subjective:    Patient ID: Emily Li, female    DOB: 07/14/1975, 40 y.o.   MRN: [id number]  HPI The patient is a 40 YO female coming in for left upper abdominal pain. Is a searing pain that happens 2-3 times per week. Alleviated with bowel movement. Has not tried taking anything for it since it is short lasting. She has been having this for the last 3-4 weeks. Around the time it started she drastically increased her fiber intake per her mother's advice. Also some change in the caliber of her stool. No blood or dark bowel movements. No weight change in the last 6 months.   Review of Systems  Constitutional: Negative for fever, activity change, appetite change, fatigue and unexpected weight change.  Respiratory: Negative for cough, chest tightness, shortness of breath and wheezing.   Cardiovascular: Negative for chest pain, palpitations and leg swelling.  Gastrointestinal: Positive for abdominal pain. Negative for nausea, diarrhea, blood in stool, abdominal distention and anal bleeding.  Musculoskeletal: Negative.   Neurological: Negative.       Objective:   Physical Exam  Constitutional: She is oriented to person, place, and time. She appears well-developed and well-nourished.  Overweight  HENT:  Head: Normocephalic and atraumatic.  Eyes: EOM are normal.  Neck: Normal range of motion.  Cardiovascular: Normal rate and regular rhythm.   Pulmonary/Chest: Effort normal. No respiratory distress. She has no wheezes. She has no rales.  Abdominal: Soft. Bowel sounds are normal. She exhibits no distension. There is no tenderness. There is no rebound.  Genitourinary:  Declines rectal exam  Neurological: She is alert and oriented to person, place, and time.  Skin: Skin is warm and dry.   Filed Vitals:   03/23/15 0844  BP: 102/62  Pulse: 77  Temp: 98 F (36.7 C)  TempSrc: Oral  Resp: 16  Height:  (1.702 m)  Weight: 271 lb (122.925 kg)  SpO2: 98%      Assessment & Plan:

## 2015-03-23 NOTE — Progress Notes (Signed)
Pre visit review using our clinic review tool, if applicable. No additional management support is needed unless otherwise documented below in the visit note. 

## 2015-03-23 NOTE — Assessment & Plan Note (Signed)
Given the proximity to her diet change have asked her to reduce her fiber intake to see if this resolves the problem. Checking CBC and lipase today to rule out other etiology. Advised rectal exam to rule out obstruction but she declines for change in caliber to stool.

## 2015-04-07 DIAGNOSIS — Z01419 Encounter for gynecological examination (general) (routine) without abnormal findings: Secondary | ICD-10-CM | POA: Diagnosis not present

## 2015-04-07 DIAGNOSIS — Z6841 Body Mass Index (BMI) 40.0 and over, adult: Secondary | ICD-10-CM | POA: Diagnosis not present

## 2015-05-14 ENCOUNTER — Other Ambulatory Visit: Payer: Self-pay | Admitting: Internal Medicine

## 2015-05-14 MED FILL — TRI-LO-MARZIA TABLET: 0.18/0.215/ | 84 days supply | Qty: 84 | Fill #1

## 2015-05-14 MED FILL — ESCITALOPRAM 10 MG TABLET: 10 | 90 days supply | Qty: 90 | Fill #3

## 2015-05-14 MED FILL — ALPRAZolam 0.25 MG TABS: 0.25 | 15 days supply | Qty: 30 | Fill #0

## 2015-08-07 MED FILL — TRI-LO-MARZIA TABLET: 0.18/0.215/ | 28 days supply | Qty: 28 | Fill #2

## 2015-08-31 MED FILL — TRI-LO-MARZIA TABLET: 0.18/0.215/ | 84 days supply | Qty: 84 | Fill #0

## 2015-10-06 ENCOUNTER — Other Ambulatory Visit: Payer: Self-pay | Admitting: Internal Medicine

## 2015-11-02 MED FILL — ESCITALOPRAM 10 MG TABLET: 10 | 90 days supply | Qty: 90 | Fill #0

## 2015-11-26 MED FILL — TRI-LO-MARZIA TABLET: 0.18/0.215/ | 84 days supply | Qty: 84 | Fill #1

## 2016-02-15 ENCOUNTER — Other Ambulatory Visit: Payer: Self-pay | Admitting: Internal Medicine

## 2016-02-15 MED FILL — TRI-LO-MARZIA TABLET: 0.18/0.215/ | 84 days supply | Qty: 84 | Fill #2

## 2016-02-15 MED FILL — ESCITALOPRAM 10 MG TABLET: 10 | 90 days supply | Qty: 90 | Fill #1 | Status: TO

## 2016-02-15 MED FILL — ALPRAZolam 0.25 MG TABS: 0.25 | 15 days supply | Qty: 30 | Fill #0

## 2016-03-09 DIAGNOSIS — H524 Presbyopia: Secondary | ICD-10-CM | POA: Diagnosis not present

## 2016-05-09 MED FILL — TRI-LO-MARZIA TABLET: 0.18/0.215/ | 28 days supply | Qty: 28 | Fill #0

## 2016-06-14 DIAGNOSIS — Z01419 Encounter for gynecological examination (general) (routine) without abnormal findings: Secondary | ICD-10-CM | POA: Diagnosis not present

## 2016-06-14 DIAGNOSIS — Z6841 Body Mass Index (BMI) 40.0 and over, adult: Secondary | ICD-10-CM | POA: Diagnosis not present

## 2016-06-15 LAB — HM PAP SMEAR

## 2016-06-23 MED FILL — ESCITALOPRAM 10 MG TABLET: 10 | 90 days supply | Qty: 90 | Fill #0

## 2016-06-23 MED FILL — TRI-LO-MARZIA TABLET: 0.18/0.215/ | 84 days supply | Qty: 84 | Fill #0

## 2016-09-19 MED FILL — TRI-LO-MARZIA TABLET: 0.18/0.215/ | 84 days supply | Qty: 84 | Fill #1

## 2016-09-19 MED FILL — ESCITALOPRAM 10 MG TABLET: 10 | 90 days supply | Qty: 90 | Fill #1

## 2016-12-14 ENCOUNTER — Other Ambulatory Visit: Payer: Self-pay | Admitting: Internal Medicine

## 2016-12-14 MED FILL — ESCITALOPRAM 10 MG TABLET: 10 | 30 days supply | Qty: 30 | Fill #0

## 2016-12-14 MED FILL — TRI-LO-MARZIA TABLET: 0.18/0.215/ | 84 days supply | Qty: 84 | Fill #2

## 2017-02-09 MED FILL — diazePAM 10 MG TABS: 10 | 1 days supply | Qty: 1 | Fill #0

## 2017-03-06 MED FILL — TRI-LO-MARZIA TABLET: 0.18/0.215/ | 84 days supply | Qty: 84 | Fill #3

## 2017-03-27 MED FILL — diazePAM 10 MG TABS: 10 | 1 days supply | Qty: 1 | Fill #0

## 2017-05-23 MED FILL — NORG-EE 0.18-0.215-0.25/0.0: 0.18/0.215/ | 28 days supply | Qty: 28 | Fill #4

## 2017-06-22 DIAGNOSIS — N760 Acute vaginitis: Secondary | ICD-10-CM | POA: Diagnosis not present

## 2017-06-22 DIAGNOSIS — Z6841 Body Mass Index (BMI) 40.0 and over, adult: Secondary | ICD-10-CM | POA: Diagnosis not present

## 2017-06-22 DIAGNOSIS — Z01419 Encounter for gynecological examination (general) (routine) without abnormal findings: Secondary | ICD-10-CM | POA: Diagnosis not present

## 2017-07-04 DIAGNOSIS — H3554 Dystrophies primarily involving the retinal pigment epithelium: Secondary | ICD-10-CM | POA: Diagnosis not present

## 2017-07-04 DIAGNOSIS — H47092 Other disorders of optic nerve, not elsewhere classified, left eye: Secondary | ICD-10-CM | POA: Diagnosis not present

## 2017-07-17 ENCOUNTER — Telehealth: Payer: 59 | Admitting: Family

## 2017-07-17 ENCOUNTER — Other Ambulatory Visit: Payer: Self-pay | Admitting: Internal Medicine

## 2017-07-17 DIAGNOSIS — L739 Follicular disorder, unspecified: Secondary | ICD-10-CM

## 2017-07-17 MED ORDER — DOXYCYCLINE HYCLATE 100 MG PO TABS: 100.0000 mg | ORAL_TABLET | Freq: Two times a day (BID) | ORAL | 0 refills | Status: DC

## 2017-07-17 MED FILL — DOXYCYCLINE HYCLATE 100 MG: 100 | 10 days supply | Qty: 20 | Fill #0

## 2017-07-17 NOTE — Progress Notes (Signed)
E Visit for Rash  We are sorry that you are not feeling well. Here is how we plan to help!  Doxycycline 100 mg twice per day for 7 days   HOME CARE:   Take cool showers and avoid direct sunlight.  Apply cool compress or wet dressings.  Take a bath in an oatmeal bath.  Sprinkle content of one Aveeno packet under running faucet with comfortably warm water.  Bathe for 15-20 minutes, 1-2 times daily.  Pat dry with a towel. Do not rub the rash.  Use hydrocortisone cream.  Take an antihistamine like Benadryl for widespread rashes that itch.  The adult dose of Benadryl is 25-50 mg by mouth 4 times daily.  Caution:  This type of medication may cause sleepiness.  Do not drink alcohol, drive, or operate dangerous machinery while taking antihistamines.  Do not take these medications if you have prostate enlargement.  Read package instructions thoroughly on all medications that you take.  GET HELP RIGHT AWAY IF:   Symptoms don't go away after treatment.  Severe itching that persists.  If you rash spreads or swells.  If you rash begins to smell.  If it blisters and opens or develops a yellow-brown crust.  You develop a fever.  You have a sore throat.  You become short of breath.  MAKE SURE YOU:  Understand these instructions. Will watch your condition. Will get help right away if you are not doing well or get worse.  Thank you for choosing an e-visit. Your e-visit answers were reviewed by a board certified advanced clinical practitioner to complete your personal care plan. Depending upon the condition, your plan could have included both over the counter or prescription medications. Please review your pharmacy choice. Be sure that the pharmacy you have chosen is open so that you can pick up your prescription now.  If there is a problem you may message your provider in MyChart to have the prescription routed to another pharmacy. Your safety is important to us. If you have drug  allergies check your prescription carefully.  For the next 24 hours, you can use MyChart to ask questions about today's visit, request a non-urgent call back, or ask for a work or school excuse from your e-visit provider. You will get an email in the next two days asking about your experience. I hope that your e-visit has been valuable and will speed your recovery.      

## 2017-07-18 DIAGNOSIS — H524 Presbyopia: Secondary | ICD-10-CM | POA: Diagnosis not present

## 2017-07-24 ENCOUNTER — Ambulatory Visit (INDEPENDENT_AMBULATORY_CARE_PROVIDER_SITE_OTHER): Payer: 59 | Admitting: Internal Medicine

## 2017-07-24 ENCOUNTER — Other Ambulatory Visit (INDEPENDENT_AMBULATORY_CARE_PROVIDER_SITE_OTHER): Payer: 59

## 2017-07-24 ENCOUNTER — Encounter: Payer: Self-pay | Admitting: Internal Medicine

## 2017-07-24 VITALS — BP 112/78 | HR 79 | Temp 98.1°F | Ht 67.0 in | Wt 292.0 lb

## 2017-07-24 DIAGNOSIS — F419 Anxiety disorder, unspecified: Secondary | ICD-10-CM

## 2017-07-24 DIAGNOSIS — Z0000 Encounter for general adult medical examination without abnormal findings: Secondary | ICD-10-CM

## 2017-07-24 DIAGNOSIS — Z23 Encounter for immunization: Secondary | ICD-10-CM | POA: Diagnosis not present

## 2017-07-24 DIAGNOSIS — F329 Major depressive disorder, single episode, unspecified: Secondary | ICD-10-CM

## 2017-07-24 LAB — COMPREHENSIVE METABOLIC PANEL
ALT: 18 U/L (ref 0–35)
AST: 14 U/L (ref 0–37)
Albumin: 3.9 g/dL (ref 3.5–5.2)
Alkaline Phosphatase: 66 U/L (ref 39–117)
BILIRUBIN TOTAL: 0.5 mg/dL (ref 0.2–1.2)
BUN: 10 mg/dL (ref 6–23)
CALCIUM: 9.2 mg/dL (ref 8.4–10.5)
CHLORIDE: 107 meq/L (ref 96–112)
CO2: 25 meq/L (ref 19–32)
Creatinine, Ser: 0.75 mg/dL (ref 0.40–1.20)
GFR: 91.09 mL/min (ref >60.00)
Glucose, Bld: 97 mg/dL (ref 70–99)
POTASSIUM: 4.4 meq/L (ref 3.5–5.1)
SODIUM: 140 meq/L (ref 135–145)
Total Protein: 6.7 g/dL (ref 6.0–8.3)

## 2017-07-24 LAB — CBC
HCT: 41.7 % (ref 36.0–46.0)
Hemoglobin: 14.1 g/dL (ref 12.0–15.0)
MCHC: 33.7 g/dL (ref 30.0–36.0)
MCV: 91.6 fl (ref 78.0–100.0)
Platelets: 190 10*3/uL (ref 150.0–400.0)
RBC: 4.56 Mil/uL (ref 3.87–5.11)
RDW: 13.1 % (ref 11.5–15.5)
WBC: 6.4 10*3/uL (ref 4.0–10.5)

## 2017-07-24 LAB — LIPID PANEL
CHOL/HDL RATIO: 5
Cholesterol: 215 mg/dL — ABNORMAL HIGH (ref 0–200)
HDL: 41.4 mg/dL (ref >39.00)
LDL Cholesterol: 142 mg/dL — ABNORMAL HIGH (ref 0–99)
NonHDL: 173.93
TRIGLYCERIDES: 159 mg/dL — AB (ref 0.0–149.0)
VLDL: 31.8 mg/dL (ref 0.0–40.0)

## 2017-07-24 LAB — VITAMIN D 25 HYDROXY (VIT D DEFICIENCY, FRACTURES): VITD: 21.49 ng/mL — ABNORMAL LOW (ref 30.00–100.00)

## 2017-07-24 LAB — FERRITIN: Ferritin: 20.1 ng/mL (ref 10.0–291.0)

## 2017-07-24 LAB — HEMOGLOBIN A1C: Hgb A1c MFr Bld: 5.5 % (ref 4.6–6.5)

## 2017-07-24 LAB — VITAMIN B12: VITAMIN B 12: 313 pg/mL (ref 211–911)

## 2017-07-24 LAB — TSH: TSH: 3.09 u[IU]/mL (ref 0.35–4.50)

## 2017-07-24 MED ORDER — DULOXETINE HCL 30 MG PO CPEP: 60.0000 mg | ORAL_CAPSULE | Freq: Every day | ORAL | 6 refills | Status: DC

## 2017-07-24 MED FILL — DULoxetine HCL 30 MG CPEP: 30 | 30 days supply | Qty: 60 | Fill #0

## 2017-07-24 NOTE — Assessment & Plan Note (Signed)
Weight is increasing and she is trying to lose weight. Considering weight loss surgery or other options.

## 2017-07-24 NOTE — Progress Notes (Signed)
   Subjective:    Patient ID: Emily Li, female    DOB: 12/12/1975, 42 y.o.   MRN: [id number]  HPI The patient is a 42 YO female coming in for physical. Anxiety is doing a lot work with increasing job stress. Had leftover lexapro so is still taking daily.   [facility], social history reviewed and updated.   Review of Systems  Constitutional: Positive for activity change, appetite change and unexpected weight change. Negative for chills, fatigue and fever.  HENT: Negative.   Eyes: Negative.   Respiratory: Negative for cough, chest tightness and shortness of breath.   Cardiovascular: Negative for chest pain, palpitations and leg swelling.  Gastrointestinal: Negative for abdominal distention, abdominal pain, constipation, diarrhea, nausea and vomiting.  Musculoskeletal: Negative.   Skin: Negative.   Neurological: Negative.   Psychiatric/Behavioral: Positive for decreased concentration, dysphoric mood and sleep disturbance. Negative for self-injury and suicidal ideas. The patient is nervous/anxious.       Objective:   Physical Exam  Constitutional: She is oriented to person, place, and time. She appears well-developed and well-nourished.  HENT:  Head: Normocephalic and atraumatic.  Eyes: EOM are normal.  Neck: Normal range of motion.  Cardiovascular: Normal rate and regular rhythm.  Pulmonary/Chest: Effort normal and breath sounds normal. No respiratory distress. She has no wheezes. She has no rales.  Abdominal: Soft. Bowel sounds are normal. She exhibits no distension. There is no tenderness. There is no rebound.  Musculoskeletal: She exhibits no edema.  Neurological: She is alert and oriented to person, place, and time. Coordination normal.  Skin: Skin is warm and dry.  Psychiatric: She has a normal mood and affect.   Vitals:   07/24/17 0944  BP: 112/78  Pulse: 79  Temp: 98.1 F (36.7 C)  TempSrc: Oral  SpO2: 97%  Weight: 292 lb (132.5 kg)  Height: 5\' 7"  (1.702 m)      Assessment & Plan:  Tdap given at visit

## 2017-07-24 NOTE — Assessment & Plan Note (Signed)
Pap smear with gyn. Flu shot yearly at work. Tetanus done at visit. Declines need for hiv screening. Counseled about sun safety and mole surveillance as well as dangers of distracted driving.

## 2017-07-24 NOTE — Patient Instructions (Signed)
We have sent in the cymbalta to switch to. Take 1 pill daily for the first month then increase to 2 pills daily if you feel you still need a boost. Let us know how it is working.   Health Maintenance, Female Adopting a healthy lifestyle and getting preventive care can go a long way to promote health and wellness. Talk with your health care provider about what schedule of regular examinations is right for you. This is a good chance for you to check in with your provider about disease prevention and staying healthy. In between checkups, there are plenty of things you can do on your own. Experts have done a lot of research about which lifestyle changes and preventive measures are most likely to keep you healthy. Ask your health care provider for more information. Weight and diet Eat a healthy diet  Be sure to include plenty of vegetables, fruits, low-fat dairy products, and lean protein.  Do not eat a lot of foods high in solid fats, added sugars, or salt.  Get regular exercise. This is one of the most important things you can do for your health. ? Most adults should exercise for at least 150 minutes each week. The exercise should increase your heart rate and make you sweat (moderate-intensity exercise). ? Most adults should also do strengthening exercises at least twice a week. This is in addition to the moderate-intensity exercise.  Maintain a healthy weight  Body mass index (BMI) is a measurement that can be used to identify possible weight problems. It estimates body fat based on height and weight. Your health care provider can help determine your BMI and help you achieve or maintain a healthy weight.  For females 40 years of age and older: ? A BMI below 18.5 is considered underweight. ? A BMI of 18.5 to 24.9 is normal. ? A BMI of 25 to 29.9 is considered overweight. ? A BMI of 30 and above is considered obese.  Watch levels of cholesterol and blood lipids  You should start having your  blood tested for lipids and cholesterol at 40 years of age, then have this test every 5 years.  You may need to have your cholesterol levels checked more often if: ? Your lipid or cholesterol levels are high. ? You are older than 40 years of age. ? You are at high risk for heart disease.  Cancer screening Lung Cancer  Lung cancer screening is recommended for adults 64-52 years old who are at high risk for lung cancer because of a history of smoking.  A yearly low-dose CT scan of the lungs is recommended for people who: ? Currently smoke. ? Have quit within the past 15 years. ? Have at least a 30-pack-year history of smoking. A pack year is smoking an average of one pack of cigarettes a day for 1 year.  Yearly screening should continue until it has been 15 years since you quit.  Yearly screening should stop if you develop a health problem that would prevent you from having lung cancer treatment.  Breast Cancer  Practice breast self-awareness. This means understanding how your breasts normally appear and feel.  It also means doing regular breast self-exams. Let your health care provider know about any changes, no matter how small.  If you are in your 20s or 30s, you should have a clinical breast exam (CBE) by a health care provider every 1-3 years as part of a regular health exam.  If you are 40 or older,  have a CBE every year. Also consider having a breast X-ray (mammogram) every year.  If you have a family history of breast cancer, talk to your health care provider about genetic screening.  If you are at high risk for breast cancer, talk to your health care provider about having an MRI and a mammogram every year.  Breast cancer gene (BRCA) assessment is recommended for women who have family members with BRCA-related cancers. BRCA-related cancers include: ? Breast. ? Ovarian. ? Tubal. ? Peritoneal cancers.  Results of the assessment will determine the need for genetic  counseling and BRCA1 and BRCA2 testing.  Cervical Cancer Your health care provider may recommend that you be screened regularly for cancer of the pelvic organs (ovaries, uterus, and vagina). This screening involves a pelvic examination, including checking for microscopic changes to the surface of your cervix (Pap test). You may be encouraged to have this screening done every 3 years, beginning at age 72.  For women ages 35-65, health care providers may recommend pelvic exams and Pap testing every 3 years, or they may recommend the Pap and pelvic exam, combined with testing for human papilloma virus (HPV), every 5 years. Some types of HPV increase your risk of cervical cancer. Testing for HPV may also be done on women of any age with unclear Pap test results.  Other health care providers may not recommend any screening for nonpregnant women who are considered low risk for pelvic cancer and who do not have symptoms. Ask your health care provider if a screening pelvic exam is right for you.  If you have had past treatment for cervical cancer or a condition that could lead to cancer, you need Pap tests and screening for cancer for at least 20 years after your treatment. If Pap tests have been discontinued, your risk factors (such as having a new sexual partner) need to be reassessed to determine if screening should resume. Some women have medical problems that increase the chance of getting cervical cancer. In these cases, your health care provider may recommend more frequent screening and Pap tests.  Colorectal Cancer  This type of cancer can be detected and often prevented.  Routine colorectal cancer screening usually begins at 40 years of age and continues through 40 years of age.  Your health care provider may recommend screening at an earlier age if you have risk factors for colon cancer.  Your health care provider may also recommend using home test kits to check for hidden blood in the  stool.  A small camera at the end of a tube can be used to examine your colon directly (sigmoidoscopy or colonoscopy). This is done to check for the earliest forms of colorectal cancer.  Routine screening usually begins at age 45.  Direct examination of the colon should be repeated every 5-10 years through 40 years of age. However, you may need to be screened more often if early forms of precancerous polyps or small growths are found.  Skin Cancer  Check your skin from head to toe regularly.  Tell your health care provider about any new moles or changes in moles, especially if there is a change in a mole's shape or color.  Also tell your health care provider if you have a mole that is larger than the size of a pencil eraser.  Always use sunscreen. Apply sunscreen liberally and repeatedly throughout the day.  Protect yourself by wearing long sleeves, pants, a wide-brimmed hat, and sunglasses whenever you are outside.  Heart disease, diabetes, and high blood pressure  High blood pressure causes heart disease and increases the risk of stroke. High blood pressure is more likely to develop in: ? People who have blood pressure in the high end of the normal range (130-139/85-89 mm Hg). ? People who are overweight or obese. ? People who are African American.  If you are 28-75 years of age, have your blood pressure checked every 3-5 years. If you are 61 years of age or older, have your blood pressure checked every year. You should have your blood pressure measured twice-once when you are at a hospital or clinic, and once when you are not at a hospital or clinic. Record the average of the two measurements. To check your blood pressure when you are not at a hospital or clinic, you can use: ? An automated blood pressure machine at a pharmacy. ? A home blood pressure monitor.  If you are between 45 years and 3 years old, ask your health care provider if you should take aspirin to prevent  strokes.  Have regular diabetes screenings. This involves taking a blood sample to check your fasting blood sugar level. ? If you are at a normal weight and have a low risk for diabetes, have this test once every three years after 40 years of age. ? If you are overweight and have a high risk for diabetes, consider being tested at a younger age or more often. Preventing infection Hepatitis B  If you have a higher risk for hepatitis B, you should be screened for this virus. You are considered at high risk for hepatitis B if: ? You were born in a country where hepatitis B is common. Ask your health care provider which countries are considered high risk. ? Your parents were born in a high-risk country, and you have not been immunized against hepatitis B (hepatitis B vaccine). ? You have HIV or AIDS. ? You use needles to inject street drugs. ? You live with someone who has hepatitis B. ? You have had sex with someone who has hepatitis B. ? You get hemodialysis treatment. ? You take certain medicines for conditions, including cancer, organ transplantation, and autoimmune conditions.  Hepatitis C  Blood testing is recommended for: ? Everyone born from [birth year] through 1963. ? Anyone with known risk factors for hepatitis C.  Sexually transmitted infections (STIs)  You should be screened for sexually transmitted infections (STIs) including gonorrhea and chlamydia if: ? You are sexually active and are younger than 40 years of age. ? You are older than 40 years of age and your health care provider tells you that you are at risk for this type of infection. ? Your sexual activity has changed since you were last screened and you are at an increased risk for chlamydia or gonorrhea. Ask your health care provider if you are at risk.  If you do not have HIV, but are at risk, it may be recommended that you take a prescription medicine daily to prevent HIV infection. This is called pre-exposure prophylaxis  (PrEP). You are considered at risk if: ? You are sexually active and do not regularly use condoms or know the HIV status of your partner(s). ? You take drugs by injection. ? You are sexually active with a partner who has HIV.  Talk with your health care provider about whether you are at high risk of being infected with HIV. If you choose to begin PrEP, you should first be tested for HIV.  You should then be tested every 3 months for as long as you are taking PrEP. Pregnancy  If you are premenopausal and you may become pregnant, ask your health care provider about preconception counseling.  If you may become pregnant, take 400 to 800 micrograms (mcg) of folic acid every day.  If you want to prevent pregnancy, talk to your health care provider about birth control (contraception). Osteoporosis and menopause  Osteoporosis is a disease in which the bones lose minerals and strength with aging. This can result in serious bone fractures. Your risk for osteoporosis can be identified using a bone density scan.  If you are 10 years of age or older, or if you are at risk for osteoporosis and fractures, ask your health care provider if you should be screened.  Ask your health care provider whether you should take a calcium or vitamin D supplement to lower your risk for osteoporosis.  Menopause may have certain physical symptoms and risks.  Hormone replacement therapy may reduce some of these symptoms and risks. Talk to your health care provider about whether hormone replacement therapy is right for you. Follow these instructions at home:  Schedule regular health, dental, and eye exams.  Stay current with your immunizations.  Do not use any tobacco products including cigarettes, chewing tobacco, or electronic cigarettes.  If you are pregnant, do not drink alcohol.  If you are breastfeeding, limit how much and how often you drink alcohol.  Limit alcohol intake to no more than 1 drink per day for  nonpregnant women. One drink equals 12 ounces of beer, 5 ounces of wine, or 1 ounces of hard liquor.  Do not use street drugs.  Do not share needles.  Ask your health care provider for help if you need support or information about quitting drugs.  Tell your health care provider if you often feel depressed.  Tell your health care provider if you have ever been abused or do not feel safe at home. This information is not intended to replace advice given to you by your health care provider. Make sure you discuss any questions you have with your health care provider. Document Released: 08/02/2010 Document Revised: 06/25/2015 Document Reviewed: 10/21/2014 Elsevier Interactive Patient Education  [employer].

## 2017-07-24 NOTE — Assessment & Plan Note (Signed)
Change lexapro 10 mg daily to cymbalta 30 mg daily for 4 weeks then increase to 60 mg daily. She will report back on efficacy.

## 2017-07-31 NOTE — Progress Notes (Unsigned)
Abstracted and sent to scan  

## 2017-08-04 ENCOUNTER — Encounter: Payer: Self-pay | Admitting: Internal Medicine

## 2017-08-07 MED ORDER — DULOXETINE HCL 60 MG PO CPEP: 60.0000 mg | ORAL_CAPSULE | Freq: Every day | ORAL | 3 refills | Status: DC

## 2017-08-10 ENCOUNTER — Telehealth: Payer: 59 | Admitting: Family

## 2017-08-10 DIAGNOSIS — L709 Acne, unspecified: Secondary | ICD-10-CM

## 2017-08-10 MED ORDER — CLINDAMYCIN PHOS-BENZOYL PEROX 1-5 % EX GEL: Freq: Two times a day (BID) | CUTANEOUS | 0 refills | Status: DC

## 2017-08-10 MED ORDER — DOXYCYCLINE HYCLATE 100 MG PO TABS: 100.0000 mg | ORAL_TABLET | Freq: Two times a day (BID) | ORAL | 0 refills | Status: DC

## 2017-08-10 MED FILL — DOXYCYCLINE HYCLATE 100 MG: 100 | 10 days supply | Qty: 20 | Fill #0

## 2017-08-10 MED FILL — CLINDAMYCIN PHOS-BENZOYL PE: 1-5 | 30 days supply | Qty: 25 | Fill #0

## 2017-08-10 NOTE — Progress Notes (Signed)
Thank you for the details you included in the comment boxes. Those details are very helpful in determining the best course of treatment for you and help us to provide the best care. The rash on your face also looks like a butterfly rash consistent with Lupus. However, if you have been wearing sunglasses and out in the sun, this often looks similar in the summertime. If you have not been in the sun or wearing sunglasses, please be seen face to face. Otherwise, see treatment plan below.  We are sorry that you are experiencing this issue.  Here is how we plan to help!  Based on what you shared with me it looks like you have uncomplicated acne.  Acne is a disorder of the hair follicles and oil glands (sebaceous glands). The sebaceous glands secrete oils to keep the skin moist.  When the glands get clogged, it can lead to pimples or cysts.  These cysts may become infected and leave scars. Acne is very common and normally occurs at puberty.  Acne is also inherited.  Your personal care plan consists of the following recommendations:  I recommend that you use a daily cleanser  You might try an over the counter cleanser that has benzoyl peroxide.  I recommend that you start with a product that has 2.5% benzoyl peroxide.  Stronger concentrations have not been shown to be more effective.  I have prescribed a topical gel with an antibiotic:  Clindamycin-benzoyl peroxide gel.  This gel should be applied to the affected areas twice a day.  Be sure to read the package insert to understand potential side effects.  I have also prescribed one of the following additional therapies:  Doxycycline an oral antibiotic 100 mg twice a day for 14 days  If excessive dryness or peeling occurs, reduce dose frequency or concentration of the topical scrubs.  If excessive stinging or burning occurs, remove the topical gel with mild soap and water and resume at a lower dose the next day.  Remember oral antibiotics and topical  acne treatments may increase your sensitivity to the sun!  HOME CARE:  Do not squeeze pimples because that can often lead to infections, worse acne, and scars.  Use a moisturizer that contains retinoid or fruit acids that may inhibit the development of new acne lesions.  Although there is not a clear link that foods can cause acne, doctors do believe that too many sweets predispose you to skin problems.  GET HELP RIGHT AWAY IF:  If your acne gets worse or is not better within 10 days.  If you become depressed.  If you become pregnant, discontinue medications and call your OB/GYN.  MAKE SURE YOU:  Understand these instructions.  Will watch your condition.  Will get help right away if you are not doing well or get worse.   Your e-visit answers were reviewed by a board certified advanced clinical practitioner to complete your personal care plan.  Depending upon the condition, your plan could have included both over the counter or prescription medications.  Please review your pharmacy choice.  If there is a problem, you may contact your provider through [employer] messaging and have the prescription routed to another pharmacy.  Your safety is important to us.  If you have drug allergies check your prescription carefully.  For the next 24 hours you can use MyChart to ask questions about today's visit, request a non-urgent call back, or ask for a work or school excuse from your e-visit provider.  You will get an email in the next two days asking about your experience. I hope that your e-visit has been valuable and will speed your recovery.

## 2017-08-23 MED FILL — DULoxetine HCL 30 MG CPEP: 30 | 30 days supply | Qty: 60 | Fill #1

## 2017-09-13 ENCOUNTER — Encounter: Payer: Self-pay | Admitting: Internal Medicine

## 2017-09-13 ENCOUNTER — Other Ambulatory Visit: Payer: 59

## 2017-09-13 ENCOUNTER — Ambulatory Visit: Payer: 59 | Admitting: Internal Medicine

## 2017-09-13 VITALS — BP 110/80 | HR 87 | Temp 98.0°F | Ht 67.0 in | Wt 287.0 lb

## 2017-09-13 DIAGNOSIS — R21 Rash and other nonspecific skin eruption: Secondary | ICD-10-CM | POA: Diagnosis not present

## 2017-09-13 MED ORDER — BRIMONIDINE TARTRATE 0.33 % EX GEL: CUTANEOUS | 6 refills | Status: DC

## 2017-09-13 MED FILL — MIRVASO 0.33% GEL PUMP: 0.33 | 30 days supply | Qty: 30 | Fill #0

## 2017-09-13 NOTE — Progress Notes (Signed)
   Subjective:    Patient ID: Emily Li, female    DOB: 05/31/1975, 42 y.o.   MRN: [id number]  HPI The patient is a 42 YO female coming in for 2 months of rash on her face. Some rash on the arms which is ongoing for about 1 week. The rash on the face is red and some macular lesions. They get worse and better. She did e-visit and given course of doxycycline and this helped short term. She did get only partial relief. She then was given clindamycin/benzoyl peroxide which did not help much. The thing that has helped the most is hydrocortisone cream 1% which keeps it at bay. Denies fevers or chills. Denies changes in medicines. Denies joint pains. Denies the rash being changed by sunlight exposure.   Review of Systems  Constitutional: Negative.   HENT: Negative.   Eyes: Negative.   Respiratory: Negative for cough, chest tightness and shortness of breath.   Cardiovascular: Negative for chest pain, palpitations and leg swelling.  Gastrointestinal: Negative for abdominal distention, abdominal pain, constipation, diarrhea, nausea and vomiting.  Musculoskeletal: Negative.   Skin: Positive for color change and rash.  Neurological: Negative.   Psychiatric/Behavioral: Negative.       Objective:   Physical Exam  Constitutional: She is oriented to person, place, and time. She appears well-developed and well-nourished.  HENT:  Head: Normocephalic and atraumatic.  Eyes: EOM are normal.  Neck: Normal range of motion.  Cardiovascular: Normal rate and regular rhythm.  Pulmonary/Chest: Effort normal and breath sounds normal. No respiratory distress. She has no wheezes. She has no rales.  Abdominal: Soft. Bowel sounds are normal. She exhibits no distension. There is no tenderness. There is no rebound.  Musculoskeletal: She exhibits no edema.  Neurological: She is alert and oriented to person, place, and time. Coordination normal.  Skin: Skin is warm and dry. Rash noted.  Some macular rash on the  arms, face with redness on the cheeks and nose and chin with some macular lesions   Vitals:   09/13/17 0936  BP: 110/80  Pulse: 87  Temp: 98 F (36.7 C)  TempSrc: Oral  SpO2: 97%  Weight: 287 lb (130.2 kg)  Height: 5\' 7"  (1.702 m)      Assessment & Plan:

## 2017-09-13 NOTE — Patient Instructions (Addendum)
We have sent in a gel medicine for the face to use twice a day.   We are checking the labs today to check for any kind of autoimmune disease causing the rash.

## 2017-09-13 NOTE — Assessment & Plan Note (Signed)
Checking for auto-immune disease and adjust as needed. Could be rosacea and rx for brimonidine gel for use to control redness.

## 2017-09-14 MED FILL — NORG-EE 0.18-0.215-0.25/0.0: 0.18/0.215/ | 84 days supply | Qty: 84 | Fill #0

## 2017-09-18 LAB — ANA, IFA COMPREHENSIVE PANEL
Anti Nuclear Antibody(ANA): POSITIVE — AB
ENA SM Ab Ser-aCnc: <1 AI
SCLERODERMA (SCL-70) (ENA) ANTIBODY, IGG: NEGATIVE AI
SM/RNP: <1 AI
SSA (RO) (ENA) ANTIBODY, IGG: NEGATIVE AI
SSB (La) (ENA) Antibody, IgG: <1 AI
ds DNA Ab: 20 IU/mL — ABNORMAL HIGH

## 2017-09-18 LAB — ANTI-NUCLEAR AB-TITER (ANA TITER): ANA Titer 1: 1:320 {titer} — ABNORMAL HIGH

## 2017-09-19 ENCOUNTER — Other Ambulatory Visit: Payer: Self-pay | Admitting: Internal Medicine

## 2017-09-19 DIAGNOSIS — M329 Systemic lupus erythematosus, unspecified: Secondary | ICD-10-CM

## 2017-09-19 DIAGNOSIS — R21 Rash and other nonspecific skin eruption: Secondary | ICD-10-CM

## 2017-09-21 MED FILL — DULoxetine HCL 60 MG CPEP: 60 | 90 days supply | Qty: 90 | Fill #0

## 2017-10-05 ENCOUNTER — Telehealth: Payer: Self-pay | Admitting: Internal Medicine

## 2017-10-05 NOTE — Telephone Encounter (Signed)
Copied from CRM [id number]. Topic: General - Other >> Oct 05, 2017  9:57 AM [name] wrote: Reason for CRM: Patient called to get the status of her referral from Dr. Okey Dupre for a rheumatologist.  The referral was sent on 09/20/17 and patient has not heard anything as of yet.  Please advise.  CB# [phone].

## 2017-10-05 NOTE — Telephone Encounter (Signed)
Referral has been faxed to [facility].  Called patient and informed.

## 2017-10-30 DIAGNOSIS — Z6841 Body Mass Index (BMI) 40.0 and over, adult: Secondary | ICD-10-CM | POA: Diagnosis not present

## 2017-10-30 DIAGNOSIS — I7300 Raynaud's syndrome without gangrene: Secondary | ICD-10-CM | POA: Diagnosis not present

## 2017-10-30 DIAGNOSIS — R768 Other specified abnormal immunological findings in serum: Secondary | ICD-10-CM | POA: Diagnosis not present

## 2017-10-30 DIAGNOSIS — R21 Rash and other nonspecific skin eruption: Secondary | ICD-10-CM | POA: Diagnosis not present

## 2017-10-30 DIAGNOSIS — R5382 Chronic fatigue, unspecified: Secondary | ICD-10-CM | POA: Diagnosis not present

## 2017-11-01 LAB — BASIC METABOLIC PANEL
BUN: 7 (ref 4–21)
CREATININE: 0.8 (ref 0.5–1.1)
Glucose: 86
Potassium: 4.7 (ref 3.4–5.3)
SODIUM: 142 (ref 137–147)

## 2017-11-01 LAB — HEPATIC FUNCTION PANEL
ALK PHOS: 78 (ref 25–125)
ALT: 35 (ref 7–35)
AST: 24 (ref 13–35)
BILIRUBIN, TOTAL: 0.3

## 2017-11-01 LAB — CBC AND DIFFERENTIAL
HCT: 44 (ref 36–46)
HEMOGLOBIN: 14.2 (ref 12.0–16.0)
NEUTROS ABS: 5
PLATELETS: 212 (ref 150–399)
WBC: 7.6

## 2017-11-01 MED FILL — HYDROXYCHLOROQUINE SULFATE: 200 | 30 days supply | Qty: 60 | Fill #0

## 2017-11-15 ENCOUNTER — Encounter: Payer: Self-pay | Admitting: Internal Medicine

## 2017-11-20 ENCOUNTER — Ambulatory Visit (INDEPENDENT_AMBULATORY_CARE_PROVIDER_SITE_OTHER): Payer: 59

## 2017-11-20 ENCOUNTER — Encounter: Payer: Self-pay | Admitting: Podiatry

## 2017-11-20 ENCOUNTER — Other Ambulatory Visit: Payer: Self-pay | Admitting: Podiatry

## 2017-11-20 ENCOUNTER — Ambulatory Visit: Payer: 59 | Admitting: Podiatry

## 2017-11-20 ENCOUNTER — Ambulatory Visit: Payer: 59

## 2017-11-20 VITALS — Resp 16

## 2017-11-20 DIAGNOSIS — M79672 Pain in left foot: Secondary | ICD-10-CM

## 2017-11-20 DIAGNOSIS — M722 Plantar fascial fibromatosis: Secondary | ICD-10-CM

## 2017-11-20 DIAGNOSIS — M79674 Pain in right toe(s): Secondary | ICD-10-CM

## 2017-11-20 MED ORDER — TRIAMCINOLONE ACETONIDE 10 MG/ML IJ SUSP
10.0000 mg | Freq: Once | INTRAMUSCULAR | Status: AC
  Administered 2017-11-20: 10 mg

## 2017-11-20 MED ORDER — DICLOFENAC SODIUM 75 MG PO TBEC: 75.0000 mg | DELAYED_RELEASE_TABLET | Freq: Two times a day (BID) | ORAL | 2 refills | Status: DC

## 2017-11-20 MED FILL — DICLOFENAC SODIUM 75 MG TAB: 75 | 25 days supply | Qty: 50 | Fill #0

## 2017-11-20 NOTE — Patient Instructions (Signed)

## 2017-11-20 NOTE — Progress Notes (Signed)
   Subjective:    Patient ID: Emily Li, female    DOB: 12/30/1975, 42 y.o.   MRN: [id number]  HPI    Review of Systems  All other systems reviewed and are negative.      Objective:   Physical Exam        Assessment & Plan:

## 2017-11-22 NOTE — Progress Notes (Signed)
Subjective:   Patient ID: Emily Li, female   DOB: 42 y.o.   MRN: [id number]   HPI Patient presents with quite a bit of discomfort plantar aspect left heel and also mild numbing-like pain around the big toe right.  Patient is recently been diagnosed with lupus but does have a problem in her heel for a number of months and it worsened gradually and is worse with cement floors and patient is obese which is a complicating factor.  Patient does not smoke and likes to be active   Review of Systems  All other systems reviewed and are negative.       Objective:  Physical Exam  Constitutional: She appears well-developed and well-nourished.  Cardiovascular: Intact distal pulses.  Pulmonary/Chest: Effort normal.  Musculoskeletal: Normal range of motion.  Neurological: She is alert.  Skin: Skin is warm.  Nursing note and vitals reviewed.   Neurovascular status was found to be intact muscle strength was adequate range of motion found to be within normal limits.  Patient was noted to have exquisite discomfort in the left plantar fascia at the insertional point of the tendon into the calcaneus with fluid buildup and is found to have mild numbness around the right big toe localized with no other pathology noted.  Patient does have good digital perfusion well oriented x3     Assessment:  Acute plantar fasciitis left with inflammation fluid around the medial band and possibility for mild localized neurological impingement right or possibility for more proximal pathology     Plan:  H&P and x-rays reviewed with patient.  Today I went ahead and I injected the left plantar fascia 3 mg Kenalog 5 mg Xylocaine and applied fascial brace to lift up the arch.  I discussed right foot do not recommend treatment but will be monitored if symptoms get worse it may need to be explored.  Placed on diclofenac 75 mg twice daily  X-rays indicate that there is spur but no indications of stress fracture or advanced  arthritis

## 2017-11-27 ENCOUNTER — Other Ambulatory Visit: Payer: Self-pay | Admitting: Internal Medicine

## 2017-11-27 NOTE — Telephone Encounter (Signed)
Copied from CRM [id number]. Topic: Quick Communication - Rx Refill/Question >> Nov 27, 2017  1:19 PM Vivia Ewing A wrote: Medication: ALPRAZolam Prudy Feeler) 0.25 MG tablet  Has the patient contacted their pharmacy? Yes.   (Agent: If no, request that the patient contact the pharmacy for the refill.) (Agent: If yes, when and what did the pharmacy advise?)  Preferred Pharmacy (with phone number or street name): [facility] - [place], [place] - [address]. [phone] (Phone) [phone] (Fax)    Agent: Please be advised that RX refills may take up to 3 business days. We ask that you follow-up with your pharmacy.

## 2017-11-28 MED ORDER — ALPRAZOLAM 0.25 MG PO TABS: 0.2500 mg | ORAL_TABLET | Freq: Two times a day (BID) | ORAL | 0 refills | Status: DC | PRN

## 2017-11-28 MED FILL — ALPRAZolam 0.25 MG TABS: 0.25 | 15 days supply | Qty: 30 | Fill #0

## 2017-11-28 NOTE — Telephone Encounter (Addendum)
Contacted pt because pharmacy note dated 02/25/16 states needs visit for more refills; no upcoming visits noted but last office visit 07/24/17; will route to office for final disposition.  Requested medication (s) are due for refill today: yes  Requested medication (s) are on the active medication list: yes  Last refill:  07/24/17  Future visit scheduled: no  Notes to clinic:  undelegated    Requested Prescriptions  Pending Prescriptions Disp Refills   ALPRAZolam (XANAX) 0.25 MG tablet 30 tablet 0    Sig: Take 1 tablet (0.25 mg total) by mouth 2 (two) times daily as needed. Needs visit for any more refills.     Not Delegated - Psychiatry:  Anxiolytics/Hypnotics Failed - 11/27/2017  2:13 PM      Failed - This refill cannot be delegated      Failed - Urine Drug Screen completed in last 360 days.      Passed - Valid encounter within last 6 months    Recent Outpatient Visits          2 months ago Rash   [facility] -Willis Modena, MD   4 months ago Routine general medical examination at a health care facility   [facility] -Willis Modena, MD   2 years ago Abdominal pain, left upper quadrant    [facility]Willis Modena[facility]   3 years ago Routine general medical examination at a health care facility   [facility] -Willis Modena, MD   4 years ago Routine general medical examination at a health care facility   [facility] -Vito Berger, Raenette Rover, MD

## 2017-11-28 NOTE — Telephone Encounter (Signed)
Control database checked last refill: 02/15/2016 LOV: 07/24/2017 cpe [id number]

## 2017-11-29 MED FILL — HYDROXYCHLOROQUINE SULFATE: 200 | 30 days supply | Qty: 60 | Fill #1

## 2017-12-04 ENCOUNTER — Encounter: Payer: Self-pay | Admitting: Podiatry

## 2017-12-04 ENCOUNTER — Ambulatory Visit (INDEPENDENT_AMBULATORY_CARE_PROVIDER_SITE_OTHER): Payer: 59 | Admitting: Podiatry

## 2017-12-04 DIAGNOSIS — M722 Plantar fascial fibromatosis: Secondary | ICD-10-CM | POA: Diagnosis not present

## 2017-12-05 NOTE — Progress Notes (Signed)
Subjective:   Patient ID: Emily Li, female   DOB: 42 y.o.   MRN: [id number]   HPI Patient presents stating that the heel is still bothering her but worse when she gets up in the morning and after periods of activity   ROS      Objective:  Physical Exam  Neurovascular status intact with discomfort still noted plantar aspect heel at the medial insertion to the calcaneus with moderate depression of the arch     Assessment:  Continuation plantar fasciitis heel     Plan:  Recommended continued supportive shoe gear usage anti-inflammatories and casted for functional orthotics today to help reduce the stress on the arch and heel.  Reappoint to pet orthotist when ready

## 2018-01-01 ENCOUNTER — Ambulatory Visit (INDEPENDENT_AMBULATORY_CARE_PROVIDER_SITE_OTHER): Payer: 59 | Admitting: Orthotics

## 2018-01-01 DIAGNOSIS — M79674 Pain in right toe(s): Secondary | ICD-10-CM

## 2018-01-01 DIAGNOSIS — M722 Plantar fascial fibromatosis: Secondary | ICD-10-CM

## 2018-01-01 NOTE — Progress Notes (Signed)
Patient came in today to pick up custom made foot orthotics.  The goals were accomplished and the patient reported no dissatisfaction with said orthotics.  Patient was advised of breakin period and how to report any issues. 

## 2018-01-03 MED FILL — DULoxetine HCL 60 MG CPEP: 60 | 90 days supply | Qty: 90 | Fill #1

## 2018-01-10 MED FILL — HYDROXYCHLOROQUINE SULFATE: 200 | 30 days supply | Qty: 60 | Fill #2

## 2018-02-12 ENCOUNTER — Ambulatory Visit (INDEPENDENT_AMBULATORY_CARE_PROVIDER_SITE_OTHER): Payer: Self-pay | Admitting: Physician Assistant

## 2018-02-12 ENCOUNTER — Ambulatory Visit (INDEPENDENT_AMBULATORY_CARE_PROVIDER_SITE_OTHER): Payer: 59

## 2018-02-12 ENCOUNTER — Encounter (HOSPITAL_COMMUNITY): Payer: Self-pay

## 2018-02-12 ENCOUNTER — Encounter: Payer: Self-pay | Admitting: Physician Assistant

## 2018-02-12 ENCOUNTER — Other Ambulatory Visit: Payer: Self-pay

## 2018-02-12 ENCOUNTER — Ambulatory Visit (HOSPITAL_COMMUNITY)
Admission: EM | Admit: 2018-02-12 | Discharge: 2018-02-12 | Disposition: A | Discharge status: Home / Self Care | Payer: 59 | Attending: Family Medicine | Admitting: Family Medicine

## 2018-02-12 VITALS — BP 100/70 | HR 111 | Temp 98.5°F | Resp 18 | Wt 289.0 lb

## 2018-02-12 DIAGNOSIS — R05 Cough: Secondary | ICD-10-CM | POA: Diagnosis not present

## 2018-02-12 DIAGNOSIS — R6889 Other general symptoms and signs: Secondary | ICD-10-CM

## 2018-02-12 DIAGNOSIS — J181 Lobar pneumonia, unspecified organism: Secondary | ICD-10-CM | POA: Diagnosis not present

## 2018-02-12 DIAGNOSIS — R110 Nausea: Secondary | ICD-10-CM

## 2018-02-12 DIAGNOSIS — J22 Unspecified acute lower respiratory infection: Secondary | ICD-10-CM

## 2018-02-12 DIAGNOSIS — J189 Pneumonia, unspecified organism: Secondary | ICD-10-CM

## 2018-02-12 DIAGNOSIS — R0602 Shortness of breath: Secondary | ICD-10-CM | POA: Diagnosis not present

## 2018-02-12 HISTORY — DX: Systemic lupus erythematosus, unspecified: M32.9

## 2018-02-12 HISTORY — DX: Reserved for concepts with insufficient information to code with codable children: IMO0002

## 2018-02-12 LAB — POCT INFLUENZA A/B
INFLUENZA A, POC: NEGATIVE
INFLUENZA B, POC: NEGATIVE

## 2018-02-12 MED ORDER — ALBUTEROL SULFATE HFA 108 (90 BASE) MCG/ACT IN AERS: 1.0000 | INHALATION_SPRAY | Freq: Four times a day (QID) | RESPIRATORY_TRACT | 0 refills | Status: DC | PRN

## 2018-02-12 MED ORDER — AMOXICILLIN-POT CLAVULANATE 875-125 MG PO TABS: 1.0000 | ORAL_TABLET | Freq: Two times a day (BID) | ORAL | 0 refills | Status: DC

## 2018-02-12 MED ORDER — ONDANSETRON 4 MG PO TBDP: 8.0000 mg | ORAL_TABLET | Freq: Once | ORAL | Status: DC

## 2018-02-12 MED ORDER — BENZONATATE 100 MG PO CAPS: 100.0000 mg | ORAL_CAPSULE | Freq: Three times a day (TID) | ORAL | 0 refills | Status: DC

## 2018-02-12 MED FILL — AMOX-CLAV 875-125 MG TABLET: 875-125 | 7 days supply | Qty: 14 | Fill #0

## 2018-02-12 MED FILL — VENTOLIN HFA 90 MCG INHALER: 108 (90 BAS | 17 days supply | Qty: 18 | Fill #0

## 2018-02-12 MED FILL — BENZONATATE 100 MG CAPS: 100 | 7 days supply | Qty: 21 | Fill #0

## 2018-02-12 NOTE — ED Triage Notes (Signed)
Pt cc coughing x 2 days. Pt went to [facility] care today they did a flu test. And told her to come in here today because her oxygen is slow.

## 2018-02-12 NOTE — Patient Instructions (Signed)
Please go to urgent care for further evaluation due to low pulse ox reading.

## 2018-02-12 NOTE — ED Provider Notes (Signed)
[facility]    CSN: [id number] Arrival date & time: 02/12/18  1117     History   Chief Complaint Chief Complaint  Patient presents with  . Cough    HPI Emily Li is a 43 y.o. female.     HPI   She presents with worsening illness over the past 10 days.  Started with nasal congestion and mild cough and has worsened over the past 2 days with low-grade fever and worsening cough.  The cough is nonproductive.  She is having mild headache with ear pain and nasal congestion.  She denies any associated chest pain, shortness of breath, body aches, nausea, vomiting, diarrhea.  She has had some recent sick contacts with viral illnesses.  Patient has a past medical history of lupus that was recently diagnosed back in September.  She was seen at the [facility] where she was found to have low oxygen saturations.  They were unable to do any imaging.  She was sent here for further evaluation chest x-ray to rule out any pulmonary etiology.  ROS per HPI  Past Medical History:  Diagnosis Date  . Anxiety   . Anxiety and depression   . Chicken pox   . Hay fever   . Lupus [facility])     Patient Active Problem List   Diagnosis Date Noted  . Rash 09/13/2017  . Routine general medical examination at a health care facility 08/07/2014  . Morbid obesity (HCC)   . Anxiety and depression 10/25/2012    Past Surgical History:  Procedure Laterality Date  . APPENDECTOMY  2001  . WISDOM TOOTH EXTRACTION      OB History   No obstetric history on file.      Home Medications    Prior to Admission medications   Medication Sig Start Date End Date Taking? Authorizing Provider  albuterol (PROVENTIL HFA;VENTOLIN HFA) 108 (90 Base) MCG/ACT inhaler Inhale 1-2 puffs into the lungs every 6 (six) hours as needed for wheezing or shortness of breath. 02/12/18   Dahlia Byes A, NP  ALPRAZolam (XANAX) 0.25 MG tablet Take 1 tablet (0.25 mg total) by mouth 2 (two) times daily as needed. Patient not  taking: Reported on 02/12/2018 11/28/17   Myrlene Broker, MD  amoxicillin-clavulanate (AUGMENTIN) 875-125 MG tablet Take 1 tablet by mouth every 12 (twelve) hours. 02/12/18   Jovi Alvizo, Gloris Manchester A, NP  benzonatate (TESSALON) 100 MG capsule Take 1 capsule (100 mg total) by mouth every 8 (eight) hours. 02/12/18   Dahlia Byes A, NP  Brimonidine Tartrate 0.33 % GEL Use on face BID Patient not taking: Reported on 02/12/2018 09/13/17   Myrlene Broker, MD  Dextromethorphan-guaiFENesin (ROBITUSSIN DM PO) Take by mouth.    [provider]  diclofenac (VOLTAREN) 75 MG EC tablet Take 1 tablet (75 mg total) by mouth 2 (two) times daily. Patient not taking: Reported on 02/12/2018 11/20/17   Lenn Sink, DPM  DULoxetine (CYMBALTA) 60 MG capsule Take 1 capsule (60 mg total) by mouth daily. Patient not taking: Reported on 02/12/2018 08/07/17   Myrlene Broker, MD  hydroxychloroquine (PLAQUENIL) 200 MG tablet Take 400 mg by mouth daily. 11/01/17   [provider]  Pseudoephedrine-APAP-DM (DAYQUIL PO) Take by mouth.    [provider]  TRI-LO-MARZIA 0.18/0.215/0.25 MG-25 MCG tab Take 1 tablet by mouth daily.  03/17/15   [provider]    Family History Family History  Problem Relation Age of Onset  . Alcohol abuse Mother   .  Hyperlipidemia Mother   . Diabetes Mother   . Alcohol abuse Father   . Cancer Father   . Hyperlipidemia Father   . Diabetes Father   . Arthritis Maternal Grandmother   . Heart disease Maternal Grandmother   . Stroke Maternal Grandmother   . Hypertension Maternal Grandmother   . Arthritis Maternal Grandfather   . Heart disease Maternal Grandfather   . Stroke Maternal Grandfather   . Hypertension Maternal Grandfather   . Cancer Maternal Aunt 54       vaginal ca    Social History Social History   Tobacco Use  . Smoking status: Never Smoker  . Smokeless tobacco: Never Used  Substance Use Topics  . Alcohol use: Yes  . Drug use: No       Allergies   Anectine [succinylcholine chloride] and Latex   Review of Systems Review of Systems   Physical Exam Triage Vital Signs ED Triage Vitals  Enc Vitals Group     BP 02/12/18 1223 105/70     Pulse Rate 02/12/18 1223 90     Resp 02/12/18 1223 18     Temp 02/12/18 1223 98.5 F (36.9 C)     Temp src --      SpO2 02/12/18 1223 93 %     Weight 02/12/18 1227 289 lb (131.1 kg)     Height --      Head Circumference --      Peak Flow --      Pain Score --      Pain Loc --      Pain Edu? --      Excl. in GC? --    No data found.  Updated Vital Signs BP 105/70 (BP Location: Left Arm)   Pulse 90   Temp 98.5 F (36.9 C)   Resp 18   Wt 289 lb (131.1 kg)   LMP 01/29/2018 (Exact Date)   SpO2 93%   BMI 45.26 kg/m   Visual Acuity Right Eye Distance:   Left Eye Distance:   Bilateral Distance:    Right Eye Near:   Left Eye Near:    Bilateral Near:     Physical Exam Vitals signs and nursing note reviewed.  Constitutional:      General: She is not in acute distress.    Appearance: Normal appearance. She is not ill-appearing, toxic-appearing or diaphoretic.  HENT:     Head: Normocephalic and atraumatic.     Right Ear: Tympanic membrane, ear canal and external ear normal.     Left Ear: Tympanic membrane, ear canal and external ear normal.     Nose: Congestion present.     Mouth/Throat:     Pharynx: Oropharynx is clear.  Eyes:     Conjunctiva/sclera: Conjunctivae normal.  Neck:     Musculoskeletal: Normal range of motion.  Cardiovascular:     Rate and Rhythm: Normal rate and regular rhythm.     Heart sounds: Normal heart sounds.  Pulmonary:     Effort: Pulmonary effort is normal.     Breath sounds: Wheezing and rhonchi present.     Comments: Mild expiratory wheezing and rhonchi heard in the left lower lung base. Musculoskeletal: Normal range of motion.  Lymphadenopathy:     Cervical: No cervical adenopathy.  Skin:    General: Skin is warm and dry.      Findings: No rash.  Neurological:     Mental Status: She is alert.  Psychiatric:  Mood and Affect: Mood normal.      UC Treatments / Results  Labs (all labs ordered are listed, but only abnormal results are displayed) Labs Reviewed - No data to display  EKG None  Radiology Dg Chest 2 View  Result Date: 02/12/2018 CLINICAL DATA:  Cough, shortness of breath EXAM: CHEST - 2 VIEW COMPARISON:  None. FINDINGS: There is left lower lobe airspace disease consistent with pneumonia. There is no pleural effusion or pneumothorax. The heart and mediastinal contours are unremarkable. The osseous structures are unremarkable. IMPRESSION: Left lower lobe pneumonia. Followup PA and lateral chest X-ray is recommended in 3-4 weeks following trial of antibiotic therapy to ensure resolution and exclude underlying malignancy. Electronically Signed   By: Elige KoHetal  Patel   On: 02/12/2018 13:00    Procedures Procedures (including critical care time)  Medications Ordered in UC Medications - No data to display  Initial Impression / Assessment and Plan / UC Course  I have reviewed the triage vital signs and the nursing notes.  Pertinent labs & imaging results that were available during my care of the patient were reviewed by me and considered in my medical decision making (see chart for details).     Patient is a 43 year old female with recent diagnosis of lupus. She presents with worsening cough, congestion, low saturations and low-grade fever.  [facility] to have a chest x-ray done based on lung sounds and low saturations in office there.  X-ray revealed lower lobe pneumonia We will treat with Augmentin twice a day for 7 days. Albuterol inhaler to use as needed for cough, wheezing, shortness of breath Tessalon Perles for cough Tylenol/ibuprofen for fever Follow up as needed for continued or worsening symptoms  Final Clinical Impressions(s) / UC Diagnoses   Final  diagnoses:  Community acquired pneumonia of left lower lobe of lung (HCC)     Discharge Instructions     Your x-ray was positive for a left lower lobe pneumonia We will treat this with antibiotics Tylenol/ibuprofen for body aches and fever Albuterol to use as needed for cough, wheezing, shortness of breath Tessalon Perles for worsening cough If your symptoms continue worsen please follow-up      ED Prescriptions    Medication Sig Dispense Auth. Provider   amoxicillin-clavulanate (AUGMENTIN) 875-125 MG tablet Take 1 tablet by mouth every 12 (twelve) hours. 14 tablet Josephine Wooldridge A, NP   benzonatate (TESSALON) 100 MG capsule Take 1 capsule (100 mg total) by mouth every 8 (eight) hours. 21 capsule Gloriana Piltz A, NP   albuterol (PROVENTIL HFA;VENTOLIN HFA) 108 (90 Base) MCG/ACT inhaler Inhale 1-2 puffs into the lungs every 6 (six) hours as needed for wheezing or shortness of breath. 1 Inhaler Dahlia ByesBast, Akeira Lahm A, NP     Controlled Substance Prescriptions [place] Controlled Substance Registry consulted? Not Applicable   Janace ArisBast, Marra Fraga A, NP 02/12/18 1421

## 2018-02-12 NOTE — Discharge Instructions (Signed)
Your x-ray was positive for a left lower lobe pneumonia We will treat this with antibiotics Tylenol/ibuprofen for body aches and fever Albuterol to use as needed for cough, wheezing, shortness of breath Tessalon Perles for worsening cough If your symptoms continue worsen please follow-up

## 2018-02-12 NOTE — Progress Notes (Addendum)
MRN: [id number] DOB: 06/16/1975  Subjective:   Emily Li A Demauro is a 43 y.o. female presenting for chief complaint of worsening illness over past 10 days. Started out with head cold and a mild cough but over the past 2 days she has felt worse. Cough has worsened. It is non productive. Developed a fever (100.1). Still having nasal congestion, headache, and ear fullness. Denies SOB, wheezing, chest pain, sinus pain, myalgia, nausea, vomiting, abdominal pain and diarrhea. Sick contact exposure to fam members with URI sx. Has PMH of recently dx suspected lupus, follows up with rheum next week. No PMH of HTN, DM, asthma, or COPD. Former smoker, quit 6 years ago. LMP 3 weeks ago, not sexually active. Had flu shot this season. Denies any other aggravating or relieving factors, no other questions or concerns.   Review of Systems  Constitutional: Negative for diaphoresis.  Respiratory: Negative for sputum production.   Musculoskeletal: Negative for neck pain.  Neurological: Negative for dizziness and weakness.    Fleet ContrasRachel has a current medication list which includes the following prescription(s): dextromethorphan-guaifenesin, pseudoephedrine-apap-dm, alprazolam, brimonidine tartrate, diclofenac, duloxetine, hydroxychloroquine, and tri-lo-marzia. Also is allergic to anectine [succinylcholine chloride] and latex.  Fleet ContrasRachel  has a past medical history of Anxiety, Anxiety and depression, Chicken pox, and Hay fever. Also  has a past surgical history that includes Appendectomy (2001) and Wisdom tooth extraction.   Objective:   Vitals: BP 100/70 (BP Location: Right Arm, Patient Position: Sitting, Cuff Size: Normal)   Pulse (!) 111   Temp 98.5 F (36.9 C) (Oral)   Resp 18   Wt 289 lb (131.1 kg)   SpO2 90%   BMI 45.26 kg/m   Physical Exam Vitals signs reviewed.  Constitutional:      General: She is not in acute distress.    Appearance: She is well-developed. She is not ill-appearing.  HENT:     Head:  Normocephalic and atraumatic.     Right Ear: Ear canal and external ear normal. A middle ear effusion is present. Tympanic membrane is not erythematous or bulging.     Left Ear: Ear canal and external ear normal. A middle ear effusion is present. Tympanic membrane is not erythematous or bulging.     Nose: Mucosal edema and congestion present.     Right Sinus: No maxillary sinus tenderness or frontal sinus tenderness.     Left Sinus: No maxillary sinus tenderness or frontal sinus tenderness.     Mouth/Throat:     Lips: Pink.     Mouth: Mucous membranes are moist.     Pharynx: Uvula midline. Posterior oropharyngeal erythema present.     Tonsils: No tonsillar exudate or tonsillar abscesses.  Eyes:     Conjunctiva/sclera: Conjunctivae normal.  Neck:     Musculoskeletal: Normal range of motion.  Cardiovascular:     Rate and Rhythm: Normal rate and regular rhythm.     Heart sounds: Normal heart sounds.  Pulmonary:     Effort: Pulmonary effort is normal.     Breath sounds: Normal breath sounds. No decreased breath sounds, wheezing, rhonchi or rales.     Comments: Difficult lung exam as each time pt asked to take a deep breath, she starts coughing. At rest, breath sounds are CTAB.  Lymphadenopathy:     Head:     Right side of head: No submental, submandibular, tonsillar, preauricular, posterior auricular or occipital adenopathy.     Left side of head: No submental, submandibular, tonsillar, preauricular, posterior auricular or occipital adenopathy.  Cervical: No cervical adenopathy.     Upper Body:     Right upper body: No supraclavicular adenopathy.     Left upper body: No supraclavicular adenopathy.  Skin:    General: Skin is warm and dry.  Neurological:     Mental Status: She is alert.     Results for orders placed or performed in visit on 02/12/18 (from the past 24 hour(s))  POCT Influenza A/B     Status: Normal   Collection Time: 02/12/18 10:48 AM  Result Value Ref Range    Influenza A, POC Negative Negative   Influenza B, POC Negative Negative     Pulse ox while ambulating drops from 94% to 90%. Remains at 90% with ambulation. HR increased from 88bpm to 111bpm w/ ambulation.    Assessment and Plan :  1. Lower respiratory infection Patient is overall well-appearing, no acute distress.  She is afebrile.  Her SpO2 today was 92% (per review of epic, it appears her baseline is typically around 97%). At one point it did improve to 94% however when she ambulates it drops to 90% and remains there for entire ambulation.  She is not tachypneic or in any respiratory distress.  She denies chest pain, shortness of breath, or dyspnea on exertion.  Difficult lung exam as patient is not wanting to take deep breaths because it causes a flareup of her cough.  However when she is breathing normally her lungs are CTAB.  Discussed with her the limitations of our office in terms of labs and imaging.  Her history is concerning for secondary sickening with a respiratory infection.  Point-of-care flu test negative.  Discussed treating empirically with antibiotic and having close follow-up tomorrow w/ strict ED precuations versus being evaluated at urgent care today with chest x-ray.  Patient would like to seek higher level of care at urgent care today. Since SpO2 has remained at 90% or above and she is in no obvious distress, believe she is stable to transport herself.  2. Flu-like symptoms - POCT Influenza A/B 3. Nausea One episode of vomiting occurred while pt was in exam room after physical exam was performed. Pt denies any prior nausea or vomiting before OV. Given 8mg  of zofran,reports nausea resolved. No abdominal pain noted.   Benjiman Core, Cordelia Poche  [facility] 02/12/2018 10:51 AM

## 2018-02-22 ENCOUNTER — Encounter: Payer: Self-pay | Admitting: Internal Medicine

## 2018-02-22 MED FILL — HYDROXYCHLOROQUINE SULFATE: 200 | 30 days supply | Qty: 60 | Fill #0

## 2018-02-26 DIAGNOSIS — I7300 Raynaud's syndrome without gangrene: Secondary | ICD-10-CM | POA: Diagnosis not present

## 2018-02-26 DIAGNOSIS — R5382 Chronic fatigue, unspecified: Secondary | ICD-10-CM | POA: Diagnosis not present

## 2018-02-26 DIAGNOSIS — R21 Rash and other nonspecific skin eruption: Secondary | ICD-10-CM | POA: Diagnosis not present

## 2018-02-26 DIAGNOSIS — R760 Raised antibody titer: Secondary | ICD-10-CM | POA: Diagnosis not present

## 2018-02-26 DIAGNOSIS — R635 Abnormal weight gain: Secondary | ICD-10-CM | POA: Diagnosis not present

## 2018-02-26 DIAGNOSIS — M329 Systemic lupus erythematosus, unspecified: Secondary | ICD-10-CM | POA: Diagnosis not present

## 2018-02-26 DIAGNOSIS — L659 Nonscarring hair loss, unspecified: Secondary | ICD-10-CM | POA: Diagnosis not present

## 2018-02-26 DIAGNOSIS — Z6841 Body Mass Index (BMI) 40.0 and over, adult: Secondary | ICD-10-CM | POA: Diagnosis not present

## 2018-03-29 MED FILL — DULoxetine HCL 60 MG CPEP: 60 | 90 days supply | Qty: 90 | Fill #2

## 2018-03-29 MED FILL — HYDROXYCHLOROQUINE SULFATE: 200 | 30 days supply | Qty: 60 | Fill #1

## 2018-04-20 ENCOUNTER — Other Ambulatory Visit: Payer: Self-pay | Admitting: General Surgery

## 2018-04-20 ENCOUNTER — Other Ambulatory Visit (HOSPITAL_COMMUNITY): Payer: Self-pay | Admitting: General Surgery

## 2018-04-26 MED FILL — HYDROXYCHLOROQUINE SULFATE: 200 | 30 days supply | Qty: 60 | Fill #0

## 2018-05-30 ENCOUNTER — Ambulatory Visit (HOSPITAL_COMMUNITY): Payer: 59

## 2018-05-30 ENCOUNTER — Other Ambulatory Visit (HOSPITAL_COMMUNITY): Payer: 59

## 2018-06-01 ENCOUNTER — Ambulatory Visit: Payer: 59 | Admitting: Dietician

## 2018-06-05 ENCOUNTER — Other Ambulatory Visit: Payer: Self-pay

## 2018-06-05 ENCOUNTER — Encounter: Payer: 59 | Attending: General Surgery | Admitting: Dietician

## 2018-06-05 VITALS — Ht 66.5 in | Wt 301.9 lb

## 2018-06-05 DIAGNOSIS — E669 Obesity, unspecified: Secondary | ICD-10-CM | POA: Diagnosis not present

## 2018-06-05 NOTE — Patient Instructions (Signed)
Begin working through the [employer] discussed today. Start with 1 or 2 to implement over the next couple of weeks, then add in another, etc.   Keep up the good habits of healthful, mindful eating and incorporating plenty of walking/ physical activity. You are already set up well for surgery!  We will see you at Pre-Op Class! Contact us in the meantime with any questions or concerns.

## 2018-06-05 NOTE — Progress Notes (Signed)
Bariatric Pre-Op Nutrition Assessment Medical Nutrition Therapy  Appt Start Time: 10:45am  End time: 12:00pm  Patient was seen on 06/05/2018 for Pre-Operative Nutrition Assessment. Assessment [facility] on 06/05/2018.   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 She and her husband have been married for a little over a year. Patient states she likes structure and does well with limited options and guidance. Patient is kind and personable, and is excited for surgery.   24-Hr Dietary Recall First Meal: egg whites + toast (or cottage cheese + toast) + coffee w/ sugar-free creamer  Snack: none Second Meal: mindful meal, such as chicken + broccoli + sweet potato (or sandwich on whole wheat with meat)  Snack: none Third Meal: chicken (or fish) + brussels sprouts  Snack: cookies  Beverages: water, sugar-free flavored water, diet soda   Food & Nutrition Related Hx Dietary Hx: Pt states her toast is thin multigrain (70 kcals/slice) and she usually chooses multi/whole grain bread options. May snack on cheese stick or cookies. Her husband does the food shopping and cooking. Pt states she tells her husband to keep snacks out of sight if he chooses to have them in the house. Pt states that, while he cooks healthy, her husband uses a lot of olive oil and glazes on foods, so she requests that he leave hers on the side so she can add as needed. Likes to have simple, limited  options and doesn't need "fancy" foods.   Estimated Daily Fluid Intake: 64 oz Supplements: none  GI / Other Notable Symptoms: diarrhea (notices when drinks lots of water), nausea (when going long periods without eating)   Physical Activity  Current average weekly physical activity: walking at work (10,000 steps on average) and walks dog at home   Estimated Energy Needs Calories: 1800 Carbohydrate: 200g Protein: 135g Fat: 50g  Pre-Op Goals Reviewed with the Patient . Track food and beverage intake (try MyFitness Pal or the Baritastic app) . Make healthy food choices while monitoring portion sizes . Avoid concentrated sugars and fried foods . Keep fat & sugar in the single digits per serving on food labels . Practice CHEWING your food (aim for applesauce consistency) . Practice not drinking 15 minutes before, during, and 30 minutes after each meal and snack . Avoid all carbonated beverages (ex: soda, sparkling beverages)  . Limit caffeinated beverages (ex: coffee, tea, energy drinks) . Avoid all sugar-sweetened beverages (ex: regular soda, sports drinks)  . Avoid alcohol  . Consume 3 meals per day or try to eat every 3-5 hours . Make a list of non-food related activities . Aim for 64-100 ounces of FLUID daily (with at least half of fluid intake being plain water)  . Aim for at least 60-80 grams of PROTEIN daily . Look for a liquid protein source that contains ?15 g protein and ?5 g carbohydrate (ex: shakes, drinks, shots) . Physical activity is an important part of a healthy lifestyle so keep it moving! The goal is to reach 150 minutes of exercise per week, including  cardiovascular and weight baring activity.  *Goals that are bolded indicate the pt would like to start working towards these  Handouts Provided Include  . Bariatric Surgery handouts (Nutrition Visits, Pre-Op Goals, Protein Shakes, Vitamins & Minerals) . Protein2O coupons and calcium samples   Learning Style &  Readiness for Change Teaching method utilized: Visual & Auditory  Demonstrated degree of understanding via: Teach Back  Barriers to learning/adherence to lifestyle change: None Identified   Next Steps Supervised Weight Loss (SWL) Visits Needed: 0  Patient is to call [facility] to enroll in Pre-Op Class (>2 weeks before surgery) and Post-Op Class (2 weeks after surgery) for further nutrition education when surgery date is scheduled.

## 2018-06-20 MED FILL — HYDROXYCHLOROQUINE 200 MG: 200 | 30 days supply | Qty: 60 | Fill #0

## 2018-07-03 MED FILL — DULoxetine HCL 60 MG CPEP: 60 | 90 days supply | Qty: 90 | Fill #3

## 2018-07-06 ENCOUNTER — Other Ambulatory Visit: Payer: Self-pay

## 2018-07-06 ENCOUNTER — Ambulatory Visit (HOSPITAL_COMMUNITY)
Admission: RE | Admit: 2018-07-06 | Discharge: 2018-07-06 | Disposition: A | Discharge status: Home / Self Care | Payer: 59 | Source: Ambulatory Visit | Attending: General Surgery | Admitting: General Surgery

## 2018-07-06 DIAGNOSIS — Z01818 Encounter for other preprocedural examination: Secondary | ICD-10-CM | POA: Diagnosis not present

## 2018-07-11 ENCOUNTER — Encounter: Payer: Self-pay | Admitting: Internal Medicine

## 2018-07-11 ENCOUNTER — Ambulatory Visit (INDEPENDENT_AMBULATORY_CARE_PROVIDER_SITE_OTHER): Payer: 59 | Admitting: Psychology

## 2018-07-11 DIAGNOSIS — F509 Eating disorder, unspecified: Secondary | ICD-10-CM | POA: Diagnosis not present

## 2018-07-12 ENCOUNTER — Ambulatory Visit (INDEPENDENT_AMBULATORY_CARE_PROVIDER_SITE_OTHER): Payer: 59 | Admitting: Internal Medicine

## 2018-07-12 ENCOUNTER — Other Ambulatory Visit: Payer: Self-pay

## 2018-07-12 ENCOUNTER — Encounter: Payer: Self-pay | Admitting: Internal Medicine

## 2018-07-12 DIAGNOSIS — K649 Unspecified hemorrhoids: Secondary | ICD-10-CM

## 2018-07-12 MED ORDER — HYDROCORTISONE (PERIANAL) 2.5 % EX CREA: 1.0000 "application " | TOPICAL_CREAM | Freq: Two times a day (BID) | CUTANEOUS | 6 refills | Status: DC

## 2018-07-12 MED FILL — PROCTOZONE-HC 2.5 % CREA: 2.5 | 15 days supply | Qty: 30 | Fill #0

## 2018-07-12 NOTE — Patient Instructions (Signed)
We will get the cream sent in for the hemorrhoids to use twice a day. This should help within 2 weeks.    Hemorrhoids Hemorrhoids are swollen veins that may develop:  In the butt (rectum). These are called internal hemorrhoids.  Around the opening of the butt (anus). These are called external hemorrhoids. Hemorrhoids can cause pain, itching, or bleeding. Most of the time, they do not cause serious problems. They usually get better with diet changes, lifestyle changes, and other home treatments. What are the causes? This condition may be caused by:  Having trouble pooping (constipation).  Pushing hard (straining) to poop.  Watery poop (diarrhea).  Pregnancy.  Being very overweight (obese).  Sitting for long periods of time.  Heavy lifting or other activity that causes you to strain.  Anal sex.  Riding a bike for a long period of time. What are the signs or symptoms? Symptoms of this condition include:  Pain.  Itching or soreness in the butt.  Bleeding from the butt.  Leaking poop.  Swelling in the area.  One or more lumps around the opening of your butt. How is this diagnosed? A doctor can often diagnose this condition by looking at the affected area. The doctor may also:  Do an exam that involves feeling the area with a gloved hand (digital rectal exam).  Examine the area inside your butt using a small tube (anoscope).  Order blood tests. This may be done if you have lost a lot of blood.  Have you get a test that involves looking inside the colon using a flexible tube with a camera on the end (sigmoidoscopy or colonoscopy). How is this treated? This condition can usually be treated at home. Your doctor may tell you to change what you eat, make lifestyle changes, or try home treatments. If these do not help, procedures can be done to remove the hemorrhoids or make them smaller. These may involve:  Placing rubber bands at the base of the hemorrhoids to cut off  their blood supply.  Injecting medicine into the hemorrhoids to shrink them.  Shining a type of light energy onto the hemorrhoids to cause them to fall off.  Doing surgery to remove the hemorrhoids or cut off their blood supply. Follow these instructions at home: Eating and drinking   Eat foods that have a lot of fiber in them. These include whole grains, beans, nuts, fruits, and vegetables.  Ask your doctor about taking products that have added fiber (fibersupplements).  Reduce the amount of fat in your diet. You can do this by: ? Eating low-fat dairy products. ? Eating less red meat. ? Avoiding processed foods.  Drink enough fluid to keep your pee (urine) pale yellow. Managing pain and swelling   Take a warm-water bath (sitz bath) for 20 minutes to ease pain. Do this 3-4 times a day. You may do this in a bathtub or using a portable sitz bath that fits over the toilet.  If told, put ice on the painful area. It may be helpful to use ice between your warm baths. ? Put ice in a plastic bag. ? Place a towel between your skin and the bag. ? Leave the ice on for 20 minutes, 2-3 times a day. General instructions  Take over-the-counter and prescription medicines only as told by your doctor. ? Medicated creams and medicines may be used as told.  Exercise often. Ask your doctor how much and what kind of exercise is best for you.  Go  to the bathroom when you have the urge to poop. Do not wait.  Avoid pushing too hard when you poop.  Keep your butt dry and clean. Use wet toilet paper or moist towelettes after pooping.  Do not sit on the toilet for a long time.  Keep all follow-up visits as told by your doctor. This is important. Contact a doctor if you:  Have pain and swelling that do not get better with treatment or medicine.  Have trouble pooping.  Cannot poop.  Have pain or swelling outside the area of the hemorrhoids. Get help right away if you have:  Bleeding that  will not stop. Summary  Hemorrhoids are swollen veins in the butt or around the opening of the butt.  They can cause pain, itching, or bleeding.  Eat foods that have a lot of fiber in them. These include whole grains, beans, nuts, fruits, and vegetables.  Take a warm-water bath (sitz bath) for 20 minutes to ease pain. Do this 3-4 times a day. This information is not intended to replace advice given to you by your health care provider. Make sure you discuss any questions you have with your health care provider. Document Released: 10/27/2007 Document Revised: 06/08/2017 Document Reviewed: 06/08/2017 Elsevier Interactive Patient Education  2019 [employer]

## 2018-07-12 NOTE — Progress Notes (Signed)
   Subjective:   Patient ID: Emily Li, female    DOB: [birth year], 43 y.o.   MRN: [id number]  HPI The patient is a 43 YO female coming in for hemorrhoids and rectal bleeding. Has had these off and on for several years. Does fiber to help with bowels and denies significant straining. Is going to be pursuing weight loss surgery hopefully in the near future. She denies lightheadedness or fatigue. She denies chest pains or SOB. Denies fevers or chills or cough. Some rectal bleeding even when not passing bowel movement of drops of blood. Has tried otc creams without relief. The barium for the study for weight loss surgery did mess up her stomach and worsened her hemorrhoids.   Review of Systems  Constitutional: Negative.   HENT: Negative.   Eyes: Negative.   Respiratory: Negative for cough, chest tightness and shortness of breath.   Cardiovascular: Negative for chest pain, palpitations and leg swelling.  Gastrointestinal: Positive for anal bleeding and rectal pain. Negative for abdominal distention, abdominal pain, constipation, diarrhea, nausea and vomiting.  Musculoskeletal: Negative.   Skin: Negative.   Neurological: Negative.   Psychiatric/Behavioral: Negative.     Objective:  Physical Exam Constitutional:      Appearance: She is well-developed.  HENT:     Head: Normocephalic and atraumatic.  Neck:     Musculoskeletal: Normal range of motion.  Cardiovascular:     Rate and Rhythm: Normal rate and regular rhythm.  Pulmonary:     Effort: Pulmonary effort is normal. No respiratory distress.     Breath sounds: Normal breath sounds. No wheezing or rales.  Abdominal:     General: Bowel sounds are normal. There is no distension.     Palpations: Abdomen is soft.     Tenderness: There is no abdominal tenderness. There is no rebound.  Genitourinary:    Comments: deferred rectal exam Skin:    General: Skin is warm and dry.  Neurological:     Mental Status: She is alert and oriented  to person, place, and time.     Coordination: Coordination normal.     Vitals:   07/12/18 1324  BP: 120/72  Pulse: 81  Temp: 98.2 F (36.8 C)  TempSrc: Oral  SpO2: 98%  Weight: (!) 302 lb (137 kg)  Height: 5' 6.5" (1.689 m)    Assessment & Plan:

## 2018-07-13 DIAGNOSIS — K649 Unspecified hemorrhoids: Secondary | ICD-10-CM | POA: Insufficient documentation

## 2018-07-13 NOTE — Assessment & Plan Note (Signed)
Rx anusol and call back if no improvement.

## 2018-07-23 ENCOUNTER — Ambulatory Visit: Payer: 59 | Admitting: Psychology

## 2018-08-14 ENCOUNTER — Ambulatory Visit (INDEPENDENT_AMBULATORY_CARE_PROVIDER_SITE_OTHER): Payer: 59 | Admitting: Psychology

## 2018-08-14 ENCOUNTER — Ambulatory Visit: Payer: 59 | Admitting: Psychology

## 2018-08-14 DIAGNOSIS — F509 Eating disorder, unspecified: Secondary | ICD-10-CM

## 2018-08-27 DIAGNOSIS — R5382 Chronic fatigue, unspecified: Secondary | ICD-10-CM | POA: Diagnosis not present

## 2018-08-27 DIAGNOSIS — L659 Nonscarring hair loss, unspecified: Secondary | ICD-10-CM | POA: Diagnosis not present

## 2018-08-27 DIAGNOSIS — M329 Systemic lupus erythematosus, unspecified: Secondary | ICD-10-CM | POA: Diagnosis not present

## 2018-08-27 DIAGNOSIS — R21 Rash and other nonspecific skin eruption: Secondary | ICD-10-CM | POA: Diagnosis not present

## 2018-08-27 DIAGNOSIS — Z6841 Body Mass Index (BMI) 40.0 and over, adult: Secondary | ICD-10-CM | POA: Diagnosis not present

## 2018-08-27 DIAGNOSIS — I7300 Raynaud's syndrome without gangrene: Secondary | ICD-10-CM | POA: Diagnosis not present

## 2018-08-27 DIAGNOSIS — R635 Abnormal weight gain: Secondary | ICD-10-CM | POA: Diagnosis not present

## 2018-08-27 DIAGNOSIS — R760 Raised antibody titer: Secondary | ICD-10-CM | POA: Diagnosis not present

## 2018-09-06 DIAGNOSIS — E7800 Pure hypercholesterolemia, unspecified: Secondary | ICD-10-CM | POA: Diagnosis not present

## 2018-09-06 DIAGNOSIS — K449 Diaphragmatic hernia without obstruction or gangrene: Secondary | ICD-10-CM | POA: Diagnosis not present

## 2018-09-06 DIAGNOSIS — M722 Plantar fascial fibromatosis: Secondary | ICD-10-CM | POA: Diagnosis not present

## 2018-09-06 DIAGNOSIS — M329 Systemic lupus erythematosus, unspecified: Secondary | ICD-10-CM | POA: Diagnosis not present

## 2018-09-10 ENCOUNTER — Other Ambulatory Visit: Payer: Self-pay

## 2018-09-10 ENCOUNTER — Encounter: Payer: 59 | Attending: General Surgery | Admitting: Skilled Nursing Facility1

## 2018-09-10 DIAGNOSIS — E669 Obesity, unspecified: Secondary | ICD-10-CM | POA: Insufficient documentation

## 2018-09-10 NOTE — Progress Notes (Signed)
Pre-Operative Nutrition Class:  Appt start time: 0228   End time:  1830.  Patient was seen on 09/10/2018 for Pre-Operative Bariatric Surgery Education [facility].   Surgery date:  Surgery type: sleeve Start weight at [facility]: 301.9 Weight today: 295  The following the learning objectives were met by the patient during this course:  Identify Pre-Op Dietary Goals and will begin 2 weeks pre-operatively  Identify appropriate sources of fluids and proteins   State protein recommendations and appropriate sources pre and post-operatively  Identify Post-Operative Dietary Goals and will follow for 2 weeks post-operatively  Identify appropriate multivitamin and calcium sources  Describe the need for physical activity post-operatively and will follow MD recommendations  State when to call healthcare provider regarding medication questions or post-operative complications  Handouts given during class include:  Pre-Op Bariatric Surgery Diet Handout  Protein Shake Handout  Post-Op Bariatric Surgery Nutrition Handout  [facility] Program Information Flyer  Support Group Information Flyer  [facility] Bariatric Supplements Price List  Follow-Up Plan: Patient will follow-up at [facility] 2 weeks post operatively for diet advancement per MD.

## 2018-09-13 ENCOUNTER — Other Ambulatory Visit: Payer: Self-pay | Admitting: Internal Medicine

## 2018-09-13 MED FILL — DULoxetine HCL 60 MG CPEP: 60 | 30 days supply | Qty: 30 | Fill #0

## 2018-09-13 MED FILL — HYDROXYCHLOROQUINE 200 MG T: 200 | 30 days supply | Qty: 60 | Fill #0

## 2018-09-28 ENCOUNTER — Ambulatory Visit: Payer: Self-pay | Admitting: General Surgery

## 2018-09-28 ENCOUNTER — Other Ambulatory Visit (HOSPITAL_COMMUNITY)
Admission: RE | Admit: 2018-09-28 | Discharge: 2018-09-28 | Disposition: A | Discharge status: Home / Self Care | Payer: 59 | Source: Ambulatory Visit | Attending: General Surgery | Admitting: General Surgery

## 2018-09-28 DIAGNOSIS — Z20828 Contact with and (suspected) exposure to other viral communicable diseases: Secondary | ICD-10-CM | POA: Insufficient documentation

## 2018-09-28 DIAGNOSIS — Z01812 Encounter for preprocedural laboratory examination: Secondary | ICD-10-CM | POA: Insufficient documentation

## 2018-09-28 LAB — SARS CORONAVIRUS 2 (TAT 6-24 HRS): SARS Coronavirus 2: NEGATIVE

## 2018-09-28 NOTE — Patient Instructions (Addendum)
DUE TO COVID-19 ONLY ONE VISITOR IS ALLOWED TO COME WITH YOU AND STAY IN THE WAITING ROOM ONLY DURING PRE OP AND PROCEDURE DAY OF SURGERY. THE 1 VISITOR MAY VISIT WITH YOU AFTER SURGERY IN YOUR PRIVATE ROOM DURING VISITING HOURS ONLY!  YOU HAVE HAD A COVID 19 TEST ON 09-28-18.  PLEASE CONTINUE THE QUARANTINE INSTRUCTIONS AS OUTLINED IN YOUR HANDOUT.                Emily Li    Your procedure is scheduled on: 10-02-18    Report to [facility] Main  Entrance    Report to Admitting at 12:10 PM     Call this number if you have problems the morning of surgery [place], NO CHEWING GUM CANDY OR MINTS.   Remember: Do not eat food After Midnight. YOU MAY HAVE CLEAR LIQUIDS FROM MIDNIGHT UNTIL 11:10AM. At 11:10AM Please finish the prescribed Pre-Surgery ENSURE drink. Nothing by mouth after you finish the ENSURE drink !   CLEAR LIQUID DIET   Foods Allowed                                                                     Foods Excluded  Coffee and tea, regular and decaf                             liquids that you cannot  Plain Jell-O any favor except red or purple                                           see through such as: Fruit ices (not with fruit pulp)                                     milk, soups, orange juice  Iced Popsicles                                    All solid food Carbonated beverages, regular and diet                                    Cranberry, grape and apple juices Sports drinks like Gatorade Lightly seasoned clear broth or consume(fat free) Sugar, honey syrup  Sample Menu Breakfast                                Lunch                                     Supper Cranberry juice                    Beef broth  Chicken broth Jell-O                                     Grape juice                           Apple juice Coffee or tea                        Jell-O                                       Popsicle                                                Coffee or tea                        Coffee or tea  _____________________________________________________________________      Take these medicines the morning of surgery with A SIP OF WATER: Duloxetine (Cymbalta), and (Alprazolam) Xanax, if needed.                     You may not have any metal on your body including hair pins and              piercings     Do not wear jewelry, make-up, lotions, powders or perfumes, deodorant              Do not wear nail polish.  Do not shave  48 hours prior to surgery.          Do not bring valuables to the hospital. [facility] IS NOT             RESPONSIBLE   FOR VALUABLES.  Contacts, dentures or bridgework may not be worn into surgery.  Leave suitcase in the car. After surgery it may be brought to your room.     Special Instructions: N/A              Please read over the following fact sheets you were given: _____________________________________________________________________             [facility] - Preparing for [facility] Before surgery, you can play an important role.  Because skin is not sterile, your skin needs to be as free of germs as possible.  You can reduce the number of germs on your skin by washing with CHG (chlorahexidine gluconate) soap before surgery.  CHG is an antiseptic cleaner which kills germs and bonds with the skin to continue killing germs even after washing. Please DO NOT use if you have an allergy to CHG or antibacterial soaps.  If your skin becomes reddened/irritated stop using the CHG and inform your nurse when you arrive at Short Stay. Do not shave (including legs and underarms) for at least 48 hours prior to the first CHG shower.  You may shave your face/neck. Please follow these instructions carefully:  1.  Shower with CHG Soap the night before surgery and the  morning of Surgery.  2.  If you choose to wash your hair,  wash your hair first as  usual with your  normal  shampoo.  3.  After you shampoo, rinse your hair and body thoroughly to remove the  shampoo.                           4.  Use CHG as you would any other liquid soap.  You can apply chg directly  to the skin and wash                       Gently with a scrungie or clean washcloth.  5.  Apply the CHG Soap to your body ONLY FROM THE NECK DOWN.   Do not use on face/ open                           Wound or open sores. Avoid contact with eyes, ears mouth and genitals (private parts).                       Wash face,  Genitals (private parts) with your normal soap.             6.  Wash thoroughly, paying special attention to the area where your surgery  will be performed.  7.  Thoroughly rinse your body with warm water from the neck down.  8.  DO NOT shower/wash with your normal soap after using and rinsing off  the CHG Soap.                9.  Pat yourself dry with a clean towel.            10.  Wear clean pajamas.            11.  Place clean sheets on your bed the night of your first shower and do not  sleep with pets. Day of Surgery : Do not apply any lotions/deodorants the morning of surgery.  Please wear clean clothes to the hospital/surgery center.  FAILURE TO FOLLOW THESE INSTRUCTIONS MAY RESULT IN THE CANCELLATION OF YOUR SURGERY PATIENT SIGNATURE_________________________________  NURSE SIGNATURE__________________________________  ________________________________________________________________________      Emily Li  An incentive spirometer is a tool that can help keep your lungs clear and active. This tool measures how well you are filling your lungs with each breath. Taking long deep breaths may help reverse or decrease the chance of developing breathing (pulmonary) problems (especially infection) following:  A long period of time when you are unable to move or be active. BEFORE THE PROCEDURE   If the spirometer  includes an indicator to show your best effort, your nurse or respiratory therapist will set it to a desired goal.  If possible, sit up straight or lean slightly forward. Try not to slouch.  Hold the incentive spirometer in an upright position. INSTRUCTIONS FOR USE  1. Sit on the edge of your bed if possible, or sit up as far as you can in bed or on a chair. 2. Hold the incentive spirometer in an upright position. 3. Breathe out normally. 4. Place the mouthpiece in your mouth and seal your lips tightly around it. 5. Breathe in slowly and as deeply as possible, raising the piston or the ball toward the top of the column. 6. Hold your breath for 3-5 seconds or for as long as possible. Allow the piston or ball to fall to the bottom of the column. 7. Remove  the mouthpiece from your mouth and breathe out normally. 8. Rest for a few seconds and repeat Steps 1 through 7 at least 10 times every 1-2 hours when you are awake. Take your time and take a few normal breaths between deep breaths. 9. The spirometer may include an indicator to show your best effort. Use the indicator as a goal to work toward during each repetition. 10. After each set of 10 deep breaths, practice coughing to be sure your lungs are clear. If you have an incision (the cut made at the time of surgery), support your incision when coughing by placing a pillow or rolled up towels firmly against it. Once you are able to get out of bed, walk around indoors and cough well. You may stop using the incentive spirometer when instructed by your caregiver.  RISKS AND COMPLICATIONS  Take your time so you do not get dizzy or light-headed.  If you are in pain, you may need to take or ask for pain medication before doing incentive spirometry. It is harder to take a deep breath if you are having pain. AFTER USE  Rest and breathe slowly and easily.  It can be helpful to keep track of a log of your progress. Your caregiver can provide you with a  simple table to help with this. If you are using the spirometer at home, follow these instructions: SEEK MEDICAL CARE IF:   You are having difficultly using the spirometer.  You have trouble using the spirometer as often as instructed.  Your pain medication is not giving enough relief while using the spirometer.  You develop fever of 100.5 F (38.1 C) or higher. SEEK IMMEDIATE MEDICAL CARE IF:   You cough up bloody sputum that had not been present before.  You develop fever of 102 F (38.9 C) or greater.  You develop worsening pain at or near the incision site. MAKE SURE YOU:   Understand these instructions.  Will watch your condition.  Will get help right away if you are not doing well or get worse. Document Released: 05/30/2006 Document Revised: 04/11/2011 Document Reviewed: 07/31/2006 [facility] Patient Information 2014 [employer] [place].   ________________________________________________________________________

## 2018-09-28 NOTE — Progress Notes (Signed)
Please place surgery orders. Pt is scheduled for her PST appointment on 10-01-18. Thank you.

## 2018-09-28 NOTE — Progress Notes (Signed)
PCP - Dr. Pricilla Holm Cardiologist -   Chest x-ray - 07-06-18 EKG - 07-06-18  Stress Test -  ECHO -  Cardiac Cath -   Sleep Study -  CPAP -   Fasting Blood Sugar -  Checks Blood Sugar _____ times a day  Blood Thinner Instructions: Aspirin Instructions: Last Dose:  Anesthesia review:   Patient denies shortness of breath, fever, cough and chest pain at PAT appointment   Patient verbalized understanding of instructions that were given to them at the PAT appointment. Patient was also instructed that they will need to review over the PAT instructions again at home before surgery.

## 2018-10-01 ENCOUNTER — Other Ambulatory Visit: Payer: Self-pay | Admitting: *Deleted

## 2018-10-01 ENCOUNTER — Other Ambulatory Visit: Payer: Self-pay

## 2018-10-01 ENCOUNTER — Encounter (HOSPITAL_COMMUNITY)
Admission: RE | Admit: 2018-10-01 | Discharge: 2018-10-01 | Disposition: A | Discharge status: Home / Self Care | Payer: 59 | Source: Ambulatory Visit | Attending: General Surgery | Admitting: General Surgery

## 2018-10-01 ENCOUNTER — Encounter (HOSPITAL_COMMUNITY): Payer: Self-pay

## 2018-10-01 DIAGNOSIS — K449 Diaphragmatic hernia without obstruction or gangrene: Secondary | ICD-10-CM | POA: Diagnosis not present

## 2018-10-01 DIAGNOSIS — Z800 Family history of malignant neoplasm of digestive organs: Secondary | ICD-10-CM | POA: Diagnosis not present

## 2018-10-01 DIAGNOSIS — Z811 Family history of alcohol abuse and dependence: Secondary | ICD-10-CM | POA: Diagnosis not present

## 2018-10-01 DIAGNOSIS — Z833 Family history of diabetes mellitus: Secondary | ICD-10-CM | POA: Diagnosis not present

## 2018-10-01 DIAGNOSIS — Z808 Family history of malignant neoplasm of other organs or systems: Secondary | ICD-10-CM | POA: Diagnosis not present

## 2018-10-01 DIAGNOSIS — Z8261 Family history of arthritis: Secondary | ICD-10-CM | POA: Diagnosis not present

## 2018-10-01 DIAGNOSIS — M329 Systemic lupus erythematosus, unspecified: Secondary | ICD-10-CM | POA: Diagnosis not present

## 2018-10-01 DIAGNOSIS — E7800 Pure hypercholesterolemia, unspecified: Secondary | ICD-10-CM | POA: Diagnosis not present

## 2018-10-01 DIAGNOSIS — Z01812 Encounter for preprocedural laboratory examination: Secondary | ICD-10-CM | POA: Insufficient documentation

## 2018-10-01 HISTORY — DX: Other complications of anesthesia, initial encounter: T88.59XA

## 2018-10-01 HISTORY — DX: Raynaud's syndrome without gangrene: I73.00

## 2018-10-01 LAB — CBC WITH DIFFERENTIAL/PLATELET
Abs Immature Granulocytes: 0.02 10*3/uL (ref 0.00–0.07)
Basophils Absolute: 0 10*3/uL (ref 0.0–0.1)
Basophils Relative: 1 %
Eosinophils Absolute: 0.3 10*3/uL (ref 0.0–0.5)
Eosinophils Relative: 5 %
HCT: 44 % (ref 36.0–46.0)
Hemoglobin: 13.9 g/dL (ref 12.0–15.0)
Immature Granulocytes: 0 %
Lymphocytes Relative: 26 %
Lymphs Abs: 1.7 10*3/uL (ref 0.7–4.0)
MCH: 30.6 pg (ref 26.0–34.0)
MCHC: 31.6 g/dL (ref 30.0–36.0)
MCV: 96.9 fL (ref 80.0–100.0)
Monocytes Absolute: 0.3 10*3/uL (ref 0.1–1.0)
Monocytes Relative: 5 %
Neutro Abs: 4.1 10*3/uL (ref 1.7–7.7)
Neutrophils Relative %: 63 %
Platelets: 182 10*3/uL (ref 150–400)
RBC: 4.54 MIL/uL (ref 3.87–5.11)
RDW: 13.2 % (ref 11.5–15.5)
WBC: 6.6 10*3/uL (ref 4.0–10.5)
nRBC: 0 % (ref 0.0–0.2)

## 2018-10-01 LAB — COMPREHENSIVE METABOLIC PANEL
ALT: 17 U/L (ref 0–44)
AST: 16 U/L (ref 15–41)
Albumin: 3.8 g/dL (ref 3.5–5.0)
Alkaline Phosphatase: 41 U/L (ref 38–126)
Anion gap: 7 (ref 5–15)
BUN: 19 mg/dL (ref 6–20)
CO2: 25 mmol/L (ref 22–32)
Calcium: 9 mg/dL (ref 8.9–10.3)
Chloride: 108 mmol/L (ref 98–111)
Creatinine, Ser: 0.67 mg/dL (ref 0.44–1.00)
GFR calc Af Amer: >60 mL/min (ref >60)
GFR calc non Af Amer: >60 mL/min (ref >60)
Glucose, Bld: 84 mg/dL (ref 70–99)
Potassium: 4.8 mmol/L (ref 3.5–5.1)
Sodium: 140 mmol/L (ref 135–145)
Total Bilirubin: 0.6 mg/dL (ref 0.3–1.2)
Total Protein: 7.1 g/dL (ref 6.5–8.1)

## 2018-10-01 LAB — ABO/RH: ABO/RH(D): A POS

## 2018-10-01 MED ORDER — BUPIVACAINE LIPOSOME 1.3 % IJ SUSP
20.0000 mL | Freq: Once | INTRAMUSCULAR | Status: DC
  Filled 2018-10-01: qty 20

## 2018-10-01 NOTE — Anesthesia Preprocedure Evaluation (Addendum)
Anesthesia Evaluation  Patient identified by MRN, date of birth, ID band Patient awake    Reviewed: Allergy & Precautions, NPO status , Patient's Chart, lab work & pertinent test results  History of Anesthesia Complications (+) PSEUDOCHOLINESTERASE DEFICIENCY  Airway Mallampati: I  TM Distance: >3 FB Neck ROM: Full    Dental  (+) Dental Advisory Given   Pulmonary former smoker,  09/28/2018 SARS coronavirus NEG   breath sounds clear to auscultation       Cardiovascular negative cardio ROS   Rhythm:Regular Rate:Normal     Neuro/Psych Anxiety Depression negative neurological ROS     GI/Hepatic negative GI ROS, Neg liver ROS,   Endo/Other  lupus  Renal/GU      Musculoskeletal   Abdominal (+) + obese,   Peds  Hematology negative hematology ROS (+)   Anesthesia Other Findings   Reproductive/Obstetrics                            Anesthesia Physical Anesthesia Plan  ASA: III  Anesthesia Plan: General   Post-op Pain Management:    Induction: Intravenous  PONV Risk Score and Plan: 4 or greater and Scopolamine patch - Pre-op, Dexamethasone and Ondansetron  Airway Management Planned: Oral ETT  Additional Equipment:   Intra-op Plan:   Post-operative Plan: Extubation in OR  Informed Consent: I have reviewed the patients History and Physical, chart, labs and discussed the procedure including the risks, benefits and alternatives for the proposed anesthesia with the patient or authorized representative who has indicated his/her understanding and acceptance.     Dental advisory given  Plan Discussed with: CRNA and Surgeon  Anesthesia Plan Comments: (Pt reports she did not tolerated succinylcholine with appendectomy in 2001.  Per PAT nurse, "Succs did not wear off for almost 4 hours and has to remain intubated until it wore off. Patient reports her post-op recovery was otherwise  uncomplicated and denies any sx of an allergic reaction to succs. Patient also states that surgeon, Dr Redmond Pulling, is aware of this issue."  Unable to find anesthesia records to review. )      Anesthesia Quick Evaluation

## 2018-10-01 NOTE — Patient Outreach (Signed)
[facility]) Care Management  10/01/2018  Emily Li Oct 13, 1975 [id number]   Preoperative Screening Call/Transition of Care Referral received: 09/17/18 Surgery/procedure date: 10/02/18 Insurance: [facility] Choice Plan  Subjective:  Initial successful telephone call to patient's home/mobile number in order to complete preoperative screening. 2 HIPAA identifiers verified. Discussed purpose of preoperative call. Patient voices understanding and agrees to call. She states she understands the reason for the surgery and the expected time of arrival ( 12:10 pm). She says she completed her preadmission testing today and has no additional questions. She says she expects to be in the hospital 1-2 days.  She states she is taking 2 weeks of paid annual leave time to recover from the surgery. She says she did not purchase the hospital indemnity insurance. She says she will have 24/7 care at home to assist in her recovery provided by her husband. She does not have and is not interested in completing advanced directives at this time. She agrees to a post hospital discharge transition of care call.    Objective:  Per chart review, Emily Li is scheduled for laparoscopic gastric sleeve surgery on 10/02/18 at [facility].  Comorbidities include: Raynaud's disease, hemorrhoids, anxiety and depression, lupus, and obesity  Assessment: Preoperative call completed, no preoperative needs identified.   Plan: RNCM will call patient for post hospital transition of care outreach within 72 hours of hospital discharge notification.  Barrington Ellison RN,CCM,CDE [place] Management Coordinator Office Phone [phone] Office Fax [phone]

## 2018-10-01 NOTE — Progress Notes (Signed)
PCP - elizabeth crawford Cardiologist -   Chest x-ray - epic 2020 EKG - epic 2020 Stress Test -  ECHO -  Cardiac Cath -   Sleep Study -  CPAP -   Fasting Blood Sugar -  Checks Blood Sugar _____ times a day  Blood Thinner Instructions: Aspirin Instructions: Last Dose:  Anesthesia review:   Reaction to succinylcholine after appendix surgery in 2001. succs did not wear off for almost 4 hours and has to remain intubated until it wore off. Patient reports her post-op recovery was otherwise uncomplicated and denies any sx of an allergic reaction to succs. Patient also states that surgeon, Dr Redmond Pulling, is aware of this issue.   Patient denies shortness of breath, fever, cough and chest pain at PAT appointment   Patient verbalized understanding of instructions that were given to them at the PAT appointment. Patient was also instructed that they will need to review over the PAT instructions again at home before surgery.

## 2018-10-02 ENCOUNTER — Encounter (HOSPITAL_COMMUNITY)
Admission: RE | Disposition: A | Discharge status: Home / Self Care | Payer: Self-pay | Source: Home / Self Care | Attending: General Surgery

## 2018-10-02 ENCOUNTER — Inpatient Hospital Stay (HOSPITAL_COMMUNITY): Payer: 59 | Admitting: Physician Assistant

## 2018-10-02 ENCOUNTER — Inpatient Hospital Stay (HOSPITAL_COMMUNITY)
Admission: RE | Admit: 2018-10-02 | Discharge: 2018-10-04 | DRG: 621 | Disposition: A | Discharge status: Home / Self Care | Payer: 59 | Attending: General Surgery | Admitting: General Surgery

## 2018-10-02 ENCOUNTER — Inpatient Hospital Stay (HOSPITAL_COMMUNITY): Payer: 59 | Admitting: Certified Registered"

## 2018-10-02 ENCOUNTER — Encounter (HOSPITAL_COMMUNITY): Payer: Self-pay | Admitting: *Deleted

## 2018-10-02 DIAGNOSIS — E669 Obesity, unspecified: Secondary | ICD-10-CM | POA: Diagnosis present

## 2018-10-02 DIAGNOSIS — K449 Diaphragmatic hernia without obstruction or gangrene: Secondary | ICD-10-CM | POA: Diagnosis present

## 2018-10-02 DIAGNOSIS — Z91040 Latex allergy status: Secondary | ICD-10-CM

## 2018-10-02 DIAGNOSIS — Z8261 Family history of arthritis: Secondary | ICD-10-CM | POA: Diagnosis not present

## 2018-10-02 DIAGNOSIS — Z87891 Personal history of nicotine dependence: Secondary | ICD-10-CM

## 2018-10-02 DIAGNOSIS — Z8042 Family history of malignant neoplasm of prostate: Secondary | ICD-10-CM

## 2018-10-02 DIAGNOSIS — Z800 Family history of malignant neoplasm of digestive organs: Secondary | ICD-10-CM | POA: Diagnosis not present

## 2018-10-02 DIAGNOSIS — Z833 Family history of diabetes mellitus: Secondary | ICD-10-CM | POA: Diagnosis not present

## 2018-10-02 DIAGNOSIS — E7800 Pure hypercholesterolemia, unspecified: Secondary | ICD-10-CM | POA: Diagnosis present

## 2018-10-02 DIAGNOSIS — F419 Anxiety disorder, unspecified: Secondary | ICD-10-CM | POA: Diagnosis present

## 2018-10-02 DIAGNOSIS — M25561 Pain in right knee: Secondary | ICD-10-CM | POA: Diagnosis not present

## 2018-10-02 DIAGNOSIS — Z811 Family history of alcohol abuse and dependence: Secondary | ICD-10-CM | POA: Diagnosis not present

## 2018-10-02 DIAGNOSIS — Z808 Family history of malignant neoplasm of other organs or systems: Secondary | ICD-10-CM

## 2018-10-02 DIAGNOSIS — Z818 Family history of other mental and behavioral disorders: Secondary | ICD-10-CM | POA: Diagnosis not present

## 2018-10-02 DIAGNOSIS — IMO0002 Reserved for concepts with insufficient information to code with codable children: Secondary | ICD-10-CM | POA: Diagnosis present

## 2018-10-02 DIAGNOSIS — Z888 Allergy status to other drugs, medicaments and biological substances status: Secondary | ICD-10-CM

## 2018-10-02 DIAGNOSIS — E66811 Obesity, class 1: Secondary | ICD-10-CM | POA: Diagnosis present

## 2018-10-02 DIAGNOSIS — Z6841 Body Mass Index (BMI) 40.0 and over, adult: Secondary | ICD-10-CM | POA: Diagnosis not present

## 2018-10-02 DIAGNOSIS — K649 Unspecified hemorrhoids: Secondary | ICD-10-CM | POA: Diagnosis not present

## 2018-10-02 DIAGNOSIS — G43909 Migraine, unspecified, not intractable, without status migrainosus: Secondary | ICD-10-CM | POA: Diagnosis present

## 2018-10-02 DIAGNOSIS — F32A Depression, unspecified: Secondary | ICD-10-CM | POA: Diagnosis present

## 2018-10-02 DIAGNOSIS — F329 Major depressive disorder, single episode, unspecified: Secondary | ICD-10-CM | POA: Diagnosis present

## 2018-10-02 DIAGNOSIS — M722 Plantar fascial fibromatosis: Secondary | ICD-10-CM | POA: Diagnosis present

## 2018-10-02 DIAGNOSIS — Z8371 Family history of colonic polyps: Secondary | ICD-10-CM

## 2018-10-02 DIAGNOSIS — M329 Systemic lupus erythematosus, unspecified: Secondary | ICD-10-CM | POA: Diagnosis present

## 2018-10-02 DIAGNOSIS — M25562 Pain in left knee: Secondary | ICD-10-CM | POA: Diagnosis not present

## 2018-10-02 DIAGNOSIS — Z1159 Encounter for screening for other viral diseases: Secondary | ICD-10-CM | POA: Diagnosis not present

## 2018-10-02 DIAGNOSIS — Z9884 Bariatric surgery status: Secondary | ICD-10-CM

## 2018-10-02 HISTORY — PX: LAPAROSCOPIC GASTRIC SLEEVE RESECTION: SHX5895

## 2018-10-02 LAB — PREGNANCY, URINE: Preg Test, Ur: NEGATIVE

## 2018-10-02 LAB — TYPE AND SCREEN
ABO/RH(D): A POS
Antibody Screen: NEGATIVE

## 2018-10-02 LAB — HEMOGLOBIN AND HEMATOCRIT, BLOOD
HCT: 44.1 % (ref 36.0–46.0)
Hemoglobin: 13.5 g/dL (ref 12.0–15.0)

## 2018-10-02 SURGERY — GASTRECTOMY, SLEEVE, LAPAROSCOPIC
Anesthesia: General

## 2018-10-02 MED ORDER — DEXAMETHASONE SODIUM PHOSPHATE 4 MG/ML IJ SOLN: 4.0000 mg | INTRAMUSCULAR | Status: DC

## 2018-10-02 MED ORDER — PROPOFOL 10 MG/ML IV BOLUS
INTRAVENOUS | Status: AC
  Filled 2018-10-02: qty 20

## 2018-10-02 MED ORDER — PROMETHAZINE HCL 25 MG/ML IJ SOLN
INTRAMUSCULAR | Status: AC
  Filled 2018-10-02: qty 1

## 2018-10-02 MED ORDER — SODIUM CHLORIDE 0.9 % IV SOLN
2.0000 g | INTRAVENOUS | Status: AC
  Administered 2018-10-02: 2 g via INTRAVENOUS
  Filled 2018-10-02: qty 2

## 2018-10-02 MED ORDER — HYDROMORPHONE HCL 1 MG/ML IJ SOLN
0.2500 mg | INTRAMUSCULAR | Status: DC | PRN
  Administered 2018-10-02: 17:00:00 0.25 mg via INTRAVENOUS
  Administered 2018-10-02: 0.5 mg via INTRAVENOUS
  Administered 2018-10-02: 0.25 mg via INTRAVENOUS

## 2018-10-02 MED ORDER — FENTANYL CITRATE (PF) 100 MCG/2ML IJ SOLN
INTRAMUSCULAR | Status: AC
  Filled 2018-10-02: qty 2

## 2018-10-02 MED ORDER — DEXAMETHASONE SODIUM PHOSPHATE 10 MG/ML IJ SOLN
INTRAMUSCULAR | Status: AC
  Filled 2018-10-02: qty 1

## 2018-10-02 MED ORDER — ONDANSETRON HCL 4 MG/2ML IJ SOLN
INTRAMUSCULAR | Status: AC
  Filled 2018-10-02: qty 2

## 2018-10-02 MED ORDER — OXYCODONE HCL 5 MG/5ML PO SOLN
5.0000 mg | Freq: Four times a day (QID) | ORAL | Status: DC | PRN
  Administered 2018-10-03 – 2018-10-04 (×3): 5 mg via ORAL
  Filled 2018-10-02 (×3): qty 5

## 2018-10-02 MED ORDER — LIDOCAINE 2% (20 MG/ML) 5 ML SYRINGE
INTRAMUSCULAR | Status: DC | PRN
  Administered 2018-10-02: 40 mg via INTRAVENOUS

## 2018-10-02 MED ORDER — SUGAMMADEX SODIUM 500 MG/5ML IV SOLN
INTRAVENOUS | Status: AC
  Filled 2018-10-02: qty 5

## 2018-10-02 MED ORDER — FENTANYL CITRATE (PF) 250 MCG/5ML IJ SOLN
INTRAMUSCULAR | Status: DC | PRN
  Administered 2018-10-02 (×2): 50 ug via INTRAVENOUS
  Administered 2018-10-02: 100 ug via INTRAVENOUS

## 2018-10-02 MED ORDER — ACETAMINOPHEN 500 MG PO TABS
1000.0000 mg | ORAL_TABLET | ORAL | Status: AC
  Administered 2018-10-02: 13:00:00 1000 mg via ORAL
  Filled 2018-10-02: qty 2

## 2018-10-02 MED ORDER — SCOPOLAMINE 1 MG/3DAYS TD PT72
1.0000 | MEDICATED_PATCH | TRANSDERMAL | Status: AC
  Administered 2018-10-02: 1 via TRANSDERMAL
  Administered 2018-10-02: 13:00:00 1.5 mg via TRANSDERMAL
  Filled 2018-10-02: qty 1

## 2018-10-02 MED ORDER — PROMETHAZINE HCL 25 MG/ML IJ SOLN
12.5000 mg | Freq: Four times a day (QID) | INTRAMUSCULAR | Status: DC | PRN
  Administered 2018-10-03: 22:00:00 12.5 mg via INTRAVENOUS
  Filled 2018-10-02: qty 1

## 2018-10-02 MED ORDER — CHLORHEXIDINE GLUCONATE 4 % EX LIQD: 60.0000 mL | Freq: Once | CUTANEOUS | Status: DC

## 2018-10-02 MED ORDER — PANTOPRAZOLE SODIUM 40 MG IV SOLR
40.0000 mg | Freq: Every day | INTRAVENOUS | Status: DC
  Administered 2018-10-02 – 2018-10-03 (×2): 40 mg via INTRAVENOUS
  Filled 2018-10-02 (×2): qty 40

## 2018-10-02 MED ORDER — SUGAMMADEX SODIUM 500 MG/5ML IV SOLN
INTRAVENOUS | Status: DC | PRN
  Administered 2018-10-02: 300 mg via INTRAVENOUS

## 2018-10-02 MED ORDER — HYDROMORPHONE HCL 1 MG/ML IJ SOLN
INTRAMUSCULAR | Status: AC
  Filled 2018-10-02: qty 1

## 2018-10-02 MED ORDER — MEPERIDINE HCL 50 MG/ML IJ SOLN: 6.2500 mg | INTRAMUSCULAR | Status: DC | PRN

## 2018-10-02 MED ORDER — ROCURONIUM BROMIDE 10 MG/ML (PF) SYRINGE
PREFILLED_SYRINGE | INTRAVENOUS | Status: DC | PRN
  Administered 2018-10-02: 60 mg via INTRAVENOUS
  Administered 2018-10-02: 10 mg via INTRAVENOUS

## 2018-10-02 MED ORDER — BUPIVACAINE LIPOSOME 1.3 % IJ SUSP
INTRAMUSCULAR | Status: DC | PRN
  Administered 2018-10-02: 20 mL

## 2018-10-02 MED ORDER — KETAMINE HCL 10 MG/ML IJ SOLN
INTRAMUSCULAR | Status: AC
  Filled 2018-10-02: qty 1

## 2018-10-02 MED ORDER — HYDROXYCHLOROQUINE SULFATE 200 MG PO TABS
400.0000 mg | ORAL_TABLET | Freq: Every day | ORAL | Status: DC
  Administered 2018-10-03 – 2018-10-04 (×2): 400 mg via ORAL
  Filled 2018-10-02 (×2): qty 2

## 2018-10-02 MED ORDER — ACETAMINOPHEN 160 MG/5ML PO SOLN
1000.0000 mg | Freq: Three times a day (TID) | ORAL | Status: DC
  Administered 2018-10-03 – 2018-10-04 (×3): 1000 mg via ORAL
  Filled 2018-10-02 (×4): qty 40.6

## 2018-10-02 MED ORDER — KETAMINE HCL 10 MG/ML IJ SOLN
INTRAMUSCULAR | Status: DC | PRN
  Administered 2018-10-02 (×2): 15 mg via INTRAVENOUS

## 2018-10-02 MED ORDER — SODIUM CHLORIDE (PF) 0.9 % IJ SOLN
INTRAMUSCULAR | Status: AC
  Filled 2018-10-02: qty 50

## 2018-10-02 MED ORDER — PROMETHAZINE HCL 25 MG/ML IJ SOLN
6.2500 mg | INTRAMUSCULAR | Status: DC | PRN
  Administered 2018-10-02: 6.25 mg via INTRAVENOUS

## 2018-10-02 MED ORDER — LACTATED RINGERS IV SOLN
INTRAVENOUS | Status: DC
  Administered 2018-10-02 (×2): via INTRAVENOUS

## 2018-10-02 MED ORDER — ENSURE MAX PROTEIN PO LIQD
2.0000 [oz_av] | ORAL | Status: DC
  Administered 2018-10-03 – 2018-10-04 (×9): 2 [oz_av] via ORAL

## 2018-10-02 MED ORDER — KCL IN DEXTROSE-NACL 20-5-0.45 MEQ/L-%-% IV SOLN
INTRAVENOUS | Status: DC
  Administered 2018-10-02 – 2018-10-04 (×5): via INTRAVENOUS
  Filled 2018-10-02 (×5): qty 1000

## 2018-10-02 MED ORDER — LIDOCAINE 2% (20 MG/ML) 5 ML SYRINGE
INTRAMUSCULAR | Status: AC
  Filled 2018-10-02: qty 5

## 2018-10-02 MED ORDER — GABAPENTIN 100 MG PO CAPS
200.0000 mg | ORAL_CAPSULE | Freq: Two times a day (BID) | ORAL | Status: DC
  Administered 2018-10-03 – 2018-10-04 (×3): 200 mg via ORAL
  Filled 2018-10-02 (×3): qty 2

## 2018-10-02 MED ORDER — ROCURONIUM BROMIDE 10 MG/ML (PF) SYRINGE
PREFILLED_SYRINGE | INTRAVENOUS | Status: AC
  Filled 2018-10-02: qty 10

## 2018-10-02 MED ORDER — PROPOFOL 10 MG/ML IV BOLUS
INTRAVENOUS | Status: DC | PRN
  Administered 2018-10-02: 200 mg via INTRAVENOUS

## 2018-10-02 MED ORDER — SODIUM CHLORIDE (PF) 0.9 % IJ SOLN
INTRAMUSCULAR | Status: DC | PRN
  Administered 2018-10-02: 50 mL via INTRAVENOUS

## 2018-10-02 MED ORDER — ENOXAPARIN SODIUM 30 MG/0.3ML ~~LOC~~ SOLN
30.0000 mg | Freq: Two times a day (BID) | SUBCUTANEOUS | Status: DC
  Administered 2018-10-02 – 2018-10-04 (×4): 30 mg via SUBCUTANEOUS
  Filled 2018-10-02 (×4): qty 0.3

## 2018-10-02 MED ORDER — MIDAZOLAM HCL 2 MG/2ML IJ SOLN: 0.5000 mg | Freq: Once | INTRAMUSCULAR | Status: DC | PRN

## 2018-10-02 MED ORDER — APREPITANT 40 MG PO CAPS
40.0000 mg | ORAL_CAPSULE | ORAL | Status: AC
  Administered 2018-10-02: 40 mg via ORAL
  Filled 2018-10-02: qty 1

## 2018-10-02 MED ORDER — LIDOCAINE 2% (20 MG/ML) 5 ML SYRINGE
INTRAMUSCULAR | Status: DC | PRN
  Administered 2018-10-02: 1.5 mg/kg/h via INTRAVENOUS

## 2018-10-02 MED ORDER — MORPHINE SULFATE (PF) 2 MG/ML IV SOLN
1.0000 mg | INTRAVENOUS | Status: DC | PRN
  Administered 2018-10-02: 2 mg via INTRAVENOUS
  Filled 2018-10-02: qty 1

## 2018-10-02 MED ORDER — GABAPENTIN 300 MG PO CAPS
300.0000 mg | ORAL_CAPSULE | ORAL | Status: AC
  Administered 2018-10-02: 13:00:00 300 mg via ORAL
  Filled 2018-10-02: qty 1

## 2018-10-02 MED ORDER — MIDAZOLAM HCL 2 MG/2ML IJ SOLN
INTRAMUSCULAR | Status: DC | PRN
  Administered 2018-10-02: 2 mg via INTRAVENOUS

## 2018-10-02 MED ORDER — DEXAMETHASONE SODIUM PHOSPHATE 10 MG/ML IJ SOLN
INTRAMUSCULAR | Status: DC | PRN
  Administered 2018-10-02: 4 mg via INTRAVENOUS

## 2018-10-02 MED ORDER — ACETAMINOPHEN 500 MG PO TABS
1000.0000 mg | ORAL_TABLET | Freq: Three times a day (TID) | ORAL | Status: DC
  Filled 2018-10-02: qty 2

## 2018-10-02 MED ORDER — EPHEDRINE SULFATE-NACL 50-0.9 MG/10ML-% IV SOSY
PREFILLED_SYRINGE | INTRAVENOUS | Status: DC | PRN
  Administered 2018-10-02 (×2): 10 mg via INTRAVENOUS

## 2018-10-02 MED ORDER — ONDANSETRON HCL 4 MG/2ML IJ SOLN
4.0000 mg | Freq: Four times a day (QID) | INTRAMUSCULAR | Status: DC | PRN
  Administered 2018-10-03 (×2): 4 mg via INTRAVENOUS
  Filled 2018-10-02 (×2): qty 2

## 2018-10-02 MED ORDER — MIDAZOLAM HCL 2 MG/2ML IJ SOLN
INTRAMUSCULAR | Status: AC
  Filled 2018-10-02: qty 2

## 2018-10-02 MED ORDER — HEPARIN SODIUM (PORCINE) 5000 UNIT/ML IJ SOLN
5000.0000 [IU] | INTRAMUSCULAR | Status: AC
  Administered 2018-10-02: 13:00:00 5000 [IU] via SUBCUTANEOUS
  Filled 2018-10-02: qty 1

## 2018-10-02 MED ORDER — 0.9 % SODIUM CHLORIDE (POUR BTL) OPTIME
TOPICAL | Status: DC | PRN
  Administered 2018-10-02: 15:00:00 1000 mL

## 2018-10-02 MED ORDER — LACTATED RINGERS IR SOLN
Status: DC | PRN
  Administered 2018-10-02: 1

## 2018-10-02 MED ORDER — ONDANSETRON HCL 4 MG/2ML IJ SOLN
INTRAMUSCULAR | Status: DC | PRN
  Administered 2018-10-02: 4 mg via INTRAVENOUS

## 2018-10-02 MED ORDER — SIMETHICONE 80 MG PO CHEW
80.0000 mg | CHEWABLE_TABLET | Freq: Four times a day (QID) | ORAL | Status: DC | PRN
  Administered 2018-10-03: 22:00:00 80 mg via ORAL
  Filled 2018-10-02: qty 1

## 2018-10-02 SURGICAL SUPPLY — 73 items
APPLICATOR COTTON TIP 6 STRL (MISCELLANEOUS) IMPLANT
APPLICATOR COTTON TIP 6IN STRL (MISCELLANEOUS)
APPLIER CLIP ROT 10 11.4 M/L (STAPLE)
APPLIER CLIP ROT 13.4 12 LRG (CLIP)
BENZOIN TINCTURE PRP APPL 2/3 (GAUZE/BANDAGES/DRESSINGS) ×3 IMPLANT
BLADE SURG SZ11 CARB STEEL (BLADE) ×3 IMPLANT
BNDG ADH 1X3 SHEER STRL LF (GAUZE/BANDAGES/DRESSINGS) ×3 IMPLANT
CABLE HIGH FREQUENCY MONO STRZ (ELECTRODE) ×3 IMPLANT
CHLORAPREP W/TINT 26 (MISCELLANEOUS) ×6 IMPLANT
CLIP APPLIE ROT 10 11.4 M/L (STAPLE) IMPLANT
CLIP APPLIE ROT 13.4 12 LRG (CLIP) IMPLANT
CLOSURE WOUND 1/2 X4 (GAUZE/BANDAGES/DRESSINGS) ×1
COVER SURGICAL LIGHT HANDLE (MISCELLANEOUS) ×3 IMPLANT
COVER WAND RF STERILE (DRAPES) IMPLANT
DECANTER SPIKE VIAL GLASS SM (MISCELLANEOUS) ×3 IMPLANT
DEVICE SUT QUICK LOAD TK 5 (STAPLE) IMPLANT
DEVICE SUT TI-KNOT TK 5X26 (MISCELLANEOUS) IMPLANT
DEVICE SUTURE ENDOST 10MM (ENDOMECHANICALS) IMPLANT
DEVICE TI KNOT TK5 (MISCELLANEOUS)
DISSECTOR BLUNT TIP ENDO 5MM (MISCELLANEOUS) IMPLANT
DRAPE UTILITY XL STRL (DRAPES) ×6 IMPLANT
ELECT L-HOOK LAP 45CM DISP (ELECTROSURGICAL)
ELECT PENCIL ROCKER SW 15FT (MISCELLANEOUS) IMPLANT
ELECT REM PT RETURN 15FT ADLT (MISCELLANEOUS) ×3 IMPLANT
ELECTRODE L-HOOK LAP 45CM DISP (ELECTROSURGICAL) IMPLANT
GAUZE SPONGE 2X2 8PLY STRL LF (GAUZE/BANDAGES/DRESSINGS) IMPLANT
GAUZE SPONGE 4X4 12PLY STRL (GAUZE/BANDAGES/DRESSINGS) IMPLANT
GLOVE BIO SURGEON STRL SZ7.5 (GLOVE) ×3 IMPLANT
GLOVE INDICATOR 8.0 STRL GRN (GLOVE) ×3 IMPLANT
GOWN STRL REUS W/TWL XL LVL3 (GOWN DISPOSABLE) ×9 IMPLANT
GRASPER SUT TROCAR 14GX15 (MISCELLANEOUS) ×3 IMPLANT
HOVERMATT SINGLE USE (MISCELLANEOUS) ×3 IMPLANT
KIT BASIN OR (CUSTOM PROCEDURE TRAY) ×3 IMPLANT
KIT TURNOVER KIT A (KITS) IMPLANT
MARKER SKIN DUAL TIP RULER LAB (MISCELLANEOUS) ×3 IMPLANT
NEEDLE SPNL 22GX3.5 QUINCKE BK (NEEDLE) ×3 IMPLANT
PACK UNIVERSAL I (CUSTOM PROCEDURE TRAY) ×3 IMPLANT
PENCIL SMOKE EVACUATOR (MISCELLANEOUS) IMPLANT
QUICK LOAD TK 5 (STAPLE)
RELOAD STAPLER 60MM BLK (STAPLE) IMPLANT
RELOAD STAPLER BLUE 60MM (STAPLE) ×1 IMPLANT
RELOAD STAPLER GOLD 60MM (STAPLE) IMPLANT
RELOAD STAPLER GREEN 60MM (STAPLE) ×1 IMPLANT
SCISSORS LAP 5X45 EPIX DISP (ENDOMECHANICALS) IMPLANT
SEALANT SURGICAL APPL DUAL CAN (MISCELLANEOUS) IMPLANT
SET IRRIG TUBING LAPAROSCOPIC (IRRIGATION / IRRIGATOR) ×3 IMPLANT
SET TUBE SMOKE EVAC HIGH FLOW (TUBING) ×3 IMPLANT
SHEARS HARMONIC ACE PLUS 45CM (MISCELLANEOUS) ×3 IMPLANT
SLEEVE GASTRECTOMY 40FR VISIGI (MISCELLANEOUS) ×3 IMPLANT
SLEEVE XCEL OPT CAN 5 100 (ENDOMECHANICALS) ×9 IMPLANT
SOL ANTI FOG 6CC (MISCELLANEOUS) ×1 IMPLANT
SOLUTION ANTI FOG 6CC (MISCELLANEOUS) ×2
SPONGE GAUZE 2X2 STER 10/PKG (GAUZE/BANDAGES/DRESSINGS)
SPONGE LAP 18X18 RF (DISPOSABLE) ×3 IMPLANT
STAPLER ECHELON BIOABSB 60 FLE (MISCELLANEOUS) ×12 IMPLANT
STAPLER ECHELON LONG 60 440 (INSTRUMENTS) ×3 IMPLANT
STAPLER RELOAD 60MM BLK (STAPLE)
STAPLER RELOAD BLUE 60MM (STAPLE) ×3
STAPLER RELOAD GOLD 60MM (STAPLE)
STAPLER RELOAD GREEN 60MM (STAPLE) ×3
STRIP CLOSURE SKIN 1/2X4 (GAUZE/BANDAGES/DRESSINGS) ×2 IMPLANT
SUT MNCRL AB 4-0 PS2 18 (SUTURE) ×3 IMPLANT
SUT SURGIDAC NAB ES-9 0 48 120 (SUTURE) IMPLANT
SUT VICRYL 0 TIES 12 18 (SUTURE) ×3 IMPLANT
SYR 20ML LL LF (SYRINGE) ×3 IMPLANT
SYR 50ML LL SCALE MARK (SYRINGE) ×3 IMPLANT
TOWEL OR 17X26 10 PK STRL BLUE (TOWEL DISPOSABLE) ×3 IMPLANT
TOWEL OR NON WOVEN STRL DISP B (DISPOSABLE) ×3 IMPLANT
TROCAR BLADELESS 15MM (ENDOMECHANICALS) ×3 IMPLANT
TROCAR BLADELESS OPT 5 100 (ENDOMECHANICALS) ×3 IMPLANT
TUBING CONNECTING 10 (TUBING) ×4 IMPLANT
TUBING CONNECTING 10' (TUBING) ×2
TUBING ENDO SMARTCAP (MISCELLANEOUS) ×3 IMPLANT

## 2018-10-02 NOTE — Progress Notes (Signed)
PHARMACY CONSULT FOR:  Risk Assessment for Post-Discharge VTE Following Bariatric Surgery  Post-Discharge VTE Risk Assessment: This patient's probability of 30-day post-discharge VTE is increased due to the factors marked:   Female    Age >/=60 years    BMI >/=50 kg/m2    CHF    Dyspnea at Rest    Paraplegia  x  Non-gastric-band surgery    Operation Time >/=3 hr    Return to OR     Length of Stay >/= 3 d      Hx of VTE   Hypercoagulable condition   Significant venous stasis   Predicted probability of 30-day post-discharge VTE: 0.16%  Other patient-specific factors to consider:   Recommendation for Discharge: No pharmacologic prophylaxis post-discharge    Emily Li is a 43 y.o. female who underwent laparoscopic sleeve gastrectomy  on 10/02/18   Case start: 1433 Case end: 1544   Allergies  Allergen Reactions  . Latex Shortness Of Breath and Rash  . Anectine [Succinylcholine Chloride] Other (See Comments)    Slowly metabolized;      Patient Measurements: Height: 5\' 6"  (167.6 cm) Weight: 291 lb 6 oz (132.2 kg) IBW/kg (Calculated) : 59.3 Body mass index is 47.03 kg/m.  Recent Labs    10/01/18 0938 10/02/18 1636  WBC 6.6  --   HGB 13.9 13.5  HCT 44.0 44.1  PLT 182  --   CREATININE 0.67  --   ALBUMIN 3.8  --   PROT 7.1  --   AST 16  --   ALT 17  --   ALKPHOS 41  --   BILITOT 0.6  --    Estimated Creatinine Clearance: 130.6 mL/min (by C-G formula based on SCr of 0.67 mg/dL).    Past Medical History:  Diagnosis Date  . Anxiety   . Anxiety and depression   . Chicken pox   . Complication of anesthesia    reports in 2001 with appendix surgery ,  after completeion of case reports she was still under influence of the paralytic used (succinycholine) ,and remained paralyzed for 3-4  hours , and had to remain intubated as well , denies any allergic raction to paralytic   . Hay fever   . Lupus ([place])    managed on plaquenil , sees rheumatology for mgmt    . Raynaud's disease    age 64  , per patient report under control       Medications Prior to Admission  Medication Sig Dispense Refill Last Dose  . DULoxetine (CYMBALTA) 60 MG capsule Take 1 capsule (60 mg total) by mouth daily. Need office visit for further refills 30 capsule 0 10/02/2018 at 0900  . hydrocortisone (ANUSOL-HC) 2.5 % rectal cream Place 1 application rectally 2 (two) times daily. (Patient taking differently: Place 1 application rectally 2 (two) times daily as needed for hemorrhoids. ) 30 g 6 Past Week at Unknown time  . hydroxychloroquine (PLAQUENIL) 200 MG tablet Take 400 mg by mouth daily.  2 10/02/2018 at 0900  . ALPRAZolam (XANAX) 0.25 MG tablet Take 1 tablet (0.25 mg total) by mouth 2 (two) times daily as needed. (Patient taking differently: Take 0.25 mg by mouth 2 (two) times daily as needed for anxiety. ) 30 tablet 0 More than a month at Unknown time       Kara Mead 10/02/2018,8:08 PM

## 2018-10-02 NOTE — Transfer of Care (Signed)
Immediate Anesthesia Transfer of Care Note  Patient: Emily Li  Procedure(s) Performed: LAPAROSCOPIC GASTRIC SLEEVE RESECTION, Upper Endo, ERAS Pathway (N/A )  Patient Location: PACU  Anesthesia Type:General  Level of Consciousness: awake, alert  and oriented  Airway & Oxygen Therapy: Patient Spontanous Breathing and Patient connected to face mask oxygen  Post-op Assessment: Report given to RN and Post -op Vital signs reviewed and stable  Post vital signs: Reviewed and stable  Last Vitals:  Vitals Value Taken Time  BP 144/88 10/02/18 1557  Temp    Pulse 72 10/02/18 1558  Resp 16 10/02/18 1558  SpO2 100 % 10/02/18 1558  Vitals shown include unvalidated device data.  Last Pain:  Vitals:   10/02/18 1557  TempSrc:   PainSc: (P) 9       Patients Stated Pain Goal: 4 (61/95/09 3267)  Complications: No apparent anesthesia complications

## 2018-10-02 NOTE — Op Note (Signed)
Preoperative diagnosis: laparoscopic sleeve gastrectomy  Postoperative diagnosis: Same   Procedure: Upper endoscopy   Surgeon: Liberty Stead, M.D.  Anesthesia: Gen.   Indications for procedure: This patient was undergoing a laparoscopic sleeve gastrectomy.   Description of procedure: The endoscopy was placed in the mouth and into the oropharynx and under endoscopic vision it was advanced to the esophagogastric junction. The pouch was insufflated and no bleeding or bubbles were seen. The GEJ was identified at 38 cm from the teeth. No bleeding or leaks were detected. The scope was withdrawn without difficulty.   Emily Li, M.D. General, Bariatric, & Minimally Invasive Surgery [facility]    

## 2018-10-02 NOTE — H&P (Signed)
Huey Romans Documented: 09/06/2018 9:04 AM Location: [facility] Patient #: [id number] DOB: [birth year] Married / Language: Cleophus Molt / Race: White Female   History of Present Illness Randall Hiss M. Jaquavious Mercer MD; 09/11/2018 10:31 AM) The patient is a 43 year old female who presents with obesity. She comes in today for me to meet her and transfer her care. She initially met with Dr. Excell Seltzer back in March but he has retired. She denies any medical changes since she saw him in March. She denies any trips to the emergency room or hospital. She is interested in sleeve gastrectomy.  She has lupus and is on Plaquenil.  She denies any chest pain, chest pressure, shortness of breath, orthopnea, dyspnea on exertion, paroxysmal nocturnal dyspnea. She may have some edema around her ankles at the end of the shift. She denies any regurgitation. She denies any nausea or vomiting. She denies any abdominal pain. She may have some intermittent mild indigestion with tomato based products but does not have to take anything for heartburn. She has a bowel movement daily. She has no melena or hematochezia. She denies any dysuria. She has had a laparoscopic appendectomy. She states that she has an issue after her appendectomy where the succinylcholine did not wear off and had a slow wake up and the recovery room. She states that she has bilateral knee pain and she has plantar fasciitis.  She denies any personal or family history of blood clots. She did not proceed to the appointment with a hematologist as mentioned in her initial visit with Dr. Excell Seltzer.   At the end of July of this year he had a normal CBC, normal comprehensive metabolic panel, normal C3 and C4 level and negative anti-DNA antibody  January 2019 she had a total cholesterol level of 215, triglyceride level of 159 and LDL level of 142.  Her upper GI showed a small sliding hiatal hernia. Chest x-ray and EKG were unremarkable. She has  been evaluated by psychology.  She denies any significant alcohol use or any drug use. She is a former smoker. She quit 5 years ago.  Dr. Harrel Carina is her rheumatologist. Presume she had lupus last September because she presented with a butterfly rash.  She works as a Education administrator at [employer]  3/18 2020 Patient is referred by Dr. Pricilla Holm for consideration for surgical treatment for morbid obesity. She is a Education administrator at [employer]. She is accompanied today by her husband. The patient gives a history of progressive obesity since about age 89 despite multiple attempts at medical management. She has been through innumerable efforts at supervised and unsupervised weight loss programs with minimal temporary success and overall progressive weight gain to her current 300 pounds which is her maximum. Obesity has been affecting the patient in a number of ways including increasing difficulties with routine activities. Obesity and obesity-related illnesses run in her family. She is currently remarkably free of significant comorbidities but is very concerned about her long-term health as she grows older The patient has been to our initial information seminar, researched surgical options thoroughly, and is interested in whatever option we feel best but leaning toward sleeve gastrectomy. Patient was recently diagnosed with lupus and is on Plaquanil. She is felt to have a tendency toward hypercoagulability and has an appointment with a hematologist being arranged to discuss perioperative management.    Problem List/Past Medical Randall Hiss M. 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 Redmond Pulling, MD; 09/11/2018 10:32 AM) Appendectomy   Oral Surgery   Diagnostic Studies History Randall Hiss M. Redmond Pulling, MD; 09/11/2018 10:32 AM) Colonoscopy  never Mammogram  never Pap Smear  1-5 years ago  Allergies (Tanisha A. Owens Shark, [place]; 09/06/2018 9:06 AM) [place] *CHEMICALS*  Latex  Allergies Reconciled   Medication History (Tanisha A. Owens Shark, [place]; 09/06/2018 9:06 AM) Plaquenil (200MG Tablet, Oral) Active. Cymbalta (60MG Capsule DR Part, Oral) Active. Medications Reconciled  Social History Randall Hiss M. Redmond Pulling, MD; 09/11/2018 10:32 AM) Alcohol use  Occasional alcohol use. Caffeine use  Carbonated beverages, Coffee. No drug use  Tobacco use  Former smoker.  Family History Randall Hiss M. Redmond Pulling, MD; 09/11/2018 10:32 AM) Alcohol Abuse  Father. Anesthetic complications  Family Members In General. Arthritis  Mother, Sister. Colon Cancer  Father. Colon Polyps  Father. Depression  Father. Diabetes Mellitus  Father. Melanoma  Father, Mother. Prostate Cancer  Father.  Pregnancy / Birth History Randall Hiss M. Redmond Pulling, MD; 09/11/2018 10:32 AM) Age at menarche  49 years. Contraceptive History  Oral contraceptives. Gravida  2 Maternal age  34-20 Para  0 Regular periods   Other Problems Randall Hiss M. Redmond Pulling, MD; 09/11/2018 10:32 AM) Anxiety Disorder  Migraine Headache     Review of Systems Randall Hiss M. Yukiko Minnich MD; 09/06/2018 9:43 AM) General Present- Weight Gain. Not Present- Appetite Loss, Chills, Fatigue, Fever, Night Sweats and Weight Loss. Skin Present- Rash. Not Present- Change in Wart/Mole, Dryness, Hives, Jaundice, New Lesions, Non-Healing Wounds and Ulcer. HEENT Not Present- Earache, Hearing Loss, Hoarseness, Nose Bleed, Oral Ulcers, Ringing in the Ears, Seasonal Allergies, Sinus Pain, Sore Throat, Visual Disturbances, Wears glasses/contact lenses and Yellow Eyes. Respiratory Not Present- Bloody sputum, Chronic Cough, Difficulty Breathing, Snoring and Wheezing. Breast Not Present- Breast Mass, Breast Pain, Nipple Discharge and Skin  Changes. Cardiovascular Not Present- Chest Pain, Difficulty Breathing Lying Down, Leg Cramps, Palpitations, Rapid Heart Rate, Shortness of Breath and Swelling of Extremities. Gastrointestinal Not Present- Abdominal Pain, Bloating, Bloody Stool, Change in Bowel Habits, Chronic diarrhea, Constipation, Difficulty Swallowing, Excessive gas, Gets full quickly at meals, Hemorrhoids, Indigestion, Nausea, Rectal Pain and Vomiting. Female Genitourinary Not Present- Frequency, Nocturia, Painful Urination, Pelvic Pain and Urgency. Musculoskeletal Not Present- Back Pain, Joint Pain, Joint Stiffness, Muscle Pain, Muscle Weakness and Swelling of Extremities. All other systems negative  Vitals (Tanisha A. Brown RMA; 09/06/2018 9:05 AM) 09/06/2018 9:04 AM Weight: 298.6 lb Height: 66in Body Surface Area: 2.37 m Body Mass Index: 48.19 kg/m  Temp.: 69F  Pulse: 91 (Regular)  BP: 128/84(Sitting, Left Arm, Standard)       Physical Exam Randall Hiss M. Redmond Whittley MD; 09/11/2018 10:31 AM) General Mental Status-Alert. General Appearance-Consistent with stated age. Hydration-Well hydrated. Voice-Normal. Note: severe obesity; evenly distributed   Head and Neck Head-normocephalic, atraumatic with no lesions or palpable masses. Trachea-midline. Thyroid Gland Characteristics - normal size and consistency.  Eye Eyeball - Bilateral-Extraocular movements intact. Sclera/Conjunctiva - Bilateral-No scleral icterus.  ENMT Ears Pinna - Bilateral - no bony growth in lateral aspect of ear canal, no drainage observed. Nose and Sinuses External Inspection of the Nose - symmetric, no crepitus palpated.  Chest and Lung Exam Chest and lung exam reveals -quiet, even and easy respiratory effort with no use of accessory muscles and on auscultation, normal breath sounds, no adventitious sounds and normal vocal resonance. Inspection Chest Wall - Normal. Back - normal.  Breast -  Did not  examine.  Cardiovascular Cardiovascular examination reveals -normal heart sounds, regular rate and rhythm with no murmurs and normal pedal pulses bilaterally.  Abdomen Inspection Inspection of the abdomen reveals - No Hernias. Skin - Scar - no surgical scars. Palpation/Percussion Palpation and Percussion of the abdomen reveal - Soft, Non Tender, No Rebound tenderness, No Rigidity (guarding) and No hepatosplenomegaly. Auscultation Auscultation of the abdomen reveals - Bowel sounds normal.  Peripheral Vascular Upper Extremity Palpation - Pulses bilaterally normal.  Neurologic Neurologic evaluation reveals -alert and oriented x 3 with no impairment of recent or remote memory. Mental Status-Normal.  Neuropsychiatric The patient's mood and affect are described as -normal. Judgment and Insight-insight is appropriate concerning matters relevant to self.  Musculoskeletal Normal Exam - Left-Upper Extremity Strength Normal and Lower Extremity Strength Normal. Normal Exam - Right-Upper Extremity Strength Normal and Lower Extremity Strength Normal. Note: bilateral knee crepitus   Lymphatic Head & Neck  General Head & Neck Lymphatics: Bilateral - Description - Normal. Axillary - Did not examine. Femoral & Inguinal - Did not examine.    Assessment & Plan Randall Hiss M. Janavia Rottman MD; 09/11/2018 10:32 AM)  SEVERE OBESITY (BMI >= 40) (E66.01) Impression: The patient meets weight loss surgery criteria. I think the patient would be an acceptable candidate for Laparoscopic vertical sleeve gastrectomy.  We rediscussed LSG. We rediscussed the preoperative, operative and postoperative process. Using diagrams, I explained the surgery in detail including the performance of an EGD near the end of the surgery. We discussed the typical hospital course including a 2-3 day stay baring any complications. The patient was given educational material. I quoted the patient that most patients can lose  up to 50-55% of their excess weight. We did discuss the possibility of weight regain several years after the procedure.  The risks of infection, bleeding, pain, scarring, weight regain, too little or too much weight loss, vitamin deficiencies and need for lifelong vitamin supplementation, hair loss, need for protein supplementation, leaks, stricture, reflux, food intolerance, gallstone formation, hernia, need for reoperation, need for open surgery, injury to spleen or surrounding structures, DVT's, PE, and death again discussed with the patient and the patient expressed understanding and desires to proceed with laparoscopic vertical sleeve gastrectomy, possible open, intraoperative endoscopy.  We discussed that before and after surgery that there would be an alteration in their diet. I explained that we have put them on a diet 2 weeks before surgery. I also explained that they would be on a liquid diet for 2 weeks after surgery. We discussed that they would have to avoid certain foods after surgery. We discussed the importance of physical activity as well as compliance with our dietary and supplement recommendations and routine follow-up.  Current Plans Pt Education - EMW_preopbariatric  SYSTEMIC LUPUS ERYTHEMATOSUS, UNSPECIFIED SLE TYPE, UNSPECIFIED ORGAN INVOLVEMENT STATUS (M32.9) Impression: i do not believe she needs a hypercoagulable workup. no Personal or family history with blood clots   HIATAL HERNIA (K44.9) Impression: We discussed the finding of a small hiatal hernia on her upper GI. I advised her that we would tests forget intraoperatively and found to have a clinically significant hiatal hernia we would repair it in order to help minimize long-term reflux after LSG   BILATERAL PLANTAR FASCIITIS (M72.2)   HYPERCHOLESTEREMIA (E78.00)  Leighton Ruff. Redmond Pulling, MD, FACS General, Bariatric, & Minimally Invasive Surgery [facility] [place]

## 2018-10-02 NOTE — Interval H&P Note (Signed)
History and Physical Interval Note:  10/02/2018 1:22 PM  Emily Li  has presented today for surgery, with the diagnosis of Morbid Obesity.  The various methods of treatment have been discussed with the patient and family. After consideration of risks, benefits and other options for treatment, the patient has consented to  Procedure(s): LAPAROSCOPIC GASTRIC SLEEVE RESECTION, Upper Endo, ERAS Pathway (N/A) as a surgical intervention.  The patient's history has been reviewed, patient examined, no change in status, stable for surgery.  I have reviewed the patient's chart and labs.  Questions were answered to the patient's satisfaction.    Possible hiatal hernia repair Leighton Ruff. Redmond Pulling, MD, FACS General, Bariatric, & Minimally Invasive Surgery [facility] [place]   Greer Pickerel

## 2018-10-02 NOTE — Op Note (Signed)
10/02/2018 Huey Romans Aug 17, 1975 [id number]   PRE-OPERATIVE DIAGNOSIS:     Severe obesity (BMI 47)   Anxiety and depression   Bilateral knee pain   Lupus ([place])   Plantar fasciitis  Hypercholesterolemia  POST-OPERATIVE DIAGNOSIS:  same  PROCEDURE:  Procedure(s): LAPAROSCOPIC SLEEVE GASTRECTOMY  UPPER GI ENDOSCOPY  SURGEON:  Surgeon(s): Gayland Curry, MD FACS FASMBS  ASSISTANTS: Gurney Maxin MD FACS  ANESTHESIA:   general  DRAINS: none   BOUGIE: 40 fr ViSiGi  LOCAL MEDICATIONS USED:   Exparel  EBL: minimal  SPECIMEN:  Source of Specimen:  Greater curvature of stomach  DISPOSITION OF SPECIMEN:  PATHOLOGY  COUNTS:  YES  INDICATION FOR PROCEDURE: This is a very pleasant 43 y.o.-year-old morbidly obese female who has had unsuccessful attempts for sustained weight loss. The patient presents today for a planned laparoscopic sleeve gastrectomy with upper endoscopy. We have discussed the risk and benefits of the procedure extensively preoperatively. Please see my separate notes.  PROCEDURE: After obtaining informed consent and receiving 5000 units of subcutaneous heparin, the patient was brought to the operating room at [facility] and placed supine on the operating room table. General endotracheal anesthesia was established. Sequential compression devices were placed. A orogastric tube was placed. The patient's abdomen was prepped and draped in the usual standard surgical fashion. The patient received preoperative IV antibiotic. A surgical timeout was performed. ERAS protocol used.   Access to the abdomen was achieved using a 5 mm 0 laparoscope thru a 5 mm trocar In the left upper Quadrant 2 fingerbreadths below the left subcostal margin using the Optiview technique. Pneumoperitoneum was smoothly established up to 15 mm of mercury. The laparoscope was advanced and the abdominal cavity was surveilled. The patient was then placed in reverse Trendelenburg.   A 5 mm  trocar was placed slightly above and to the left of the umbilicus under direct visualization.  The [facility] liver retractor was placed under the left lobe of the liver through a 5 mm trocar incision site in the subxiphoid position. A 5 mm trocar was placed in the lateral right upper quadrant along with a 15 mm trocar in the mid right abdomen. A final 5 mm trocar was placed in the lateral LUQ.  All under direct visualization after exparel had been infiltrated in bilateral lateral upper abdominal walls as a TAP block.  The stomach was inspected. It was completely decompressed and the orogastric tube was removed.  There was no small anterior dimple that was obviously visible.  Her preop upper GI showed a small sliding hiatal hernia.  The calibration tube was placed in the oropharynx and guided down into the stomach by the CRNA. 10 mL of air was insufflated into the calibration balloon. The calibration tubing was then gently pulled back by the CRNA and it did not slide past the GE junction. At this point the calibration tubing was desufflated and pulled back into the esophagus. This confirmed my suspicion of no clinically significant hiatal hernia.   We identified the pylorus and measured 6 cm proximal to the pylorus and identified an area of where we would start taking down the short gastric vessels. Harmonic scalpel was used to take down the short gastric vessels along the greater curvature of the stomach. We were able to enter the lesser sac. We continued to march along the greater curvature of the stomach taking down the short gastrics. As we approached the gastrosplenic ligament we took care in this area not to  injure the spleen. We were able to take down the entire gastrosplenic ligament. We then mobilized the fundus away from the left crus of diaphragm. There were a few posterior gastric avascular attachments which were taken down.  This left the stomach completely mobilized. No vessels had been taken down  along the lesser curvature of the stomach.  We then reidentified the pylorus. A 40Fr ViSiGi was then placed in the oropharynx and advanced down into the stomach and placed in the distal antrum and positioned along the lesser curvature. It was placed under suction which secured the 40Fr ViSiGi in place along the lesser curve. Then using the [place]  60 mm stapler with a purple load I placed a stapler along the antrum approximately 5 cm from the pylorus. The stapler was angled so that there is ample room at the angularis incisura. I then fired the first staple load after inspecting it posteriorly to ensure adequate space both anteriorly and posteriorly. At this point I still was not completely past the angularis so with another purple load, I placed the stapler in position just inside the prior stapleline. We then rotated the stomach to insure that there was adequate anteriorly as well as posteriorly. The stapler was then fired.  At this point I started using 60 mm orangle load staple cartridges. The echelon stapler was then repositioned with a 60 mm orange  and we continued to march up along the ViSiGi. My assistant was holding traction along the greater curvature stomach along the cauterized short gastric vessels ensuring that the stomach was symmetrically retracted. Prior to each firing of the staple, we rotated the stomach to ensure that there is adequate stomach left.  As we approached the fundus, I used 60 mm orange cartridge aiming  lateral to the GE junction after mobilizing some of the esophageal fat pad.  The sleeve was inspected. There is no evidence of cork screw. The staple line appeared hemostatic. The CRNA inflated the ViSiGi to the green zone and the upper abdomen was flooded with saline. There were no bubbles. The sleeve was decompressed and the ViSiGi removed. My assistant scrubbed out and performed an upper endoscopy. The sleeve easily distended with air and the scope was easily advanced to the  pylorus. There is no evidence of internal bleeding or cork screwing. There was no narrowing at the angularis. There is no evidence of bubbles. Please see his operative note for further details. The gastric sleeve was decompressed and the endoscope was removed.  The greater curvature the stomach was grasped with a laparoscopic grasper and removed from the 15 mm trocar site.  The liver retractor was removed. I then closed the 15 mm trocar site with 1 interrupted 0 Vicryl sutures through the fascia using the endoclose. The closure was viewed laparoscopically and it was airtight. Remaining Exparel was then infiltrated in the preperitoneal spaces around the trocar sites. Pneumoperitoneum was released. All trocar sites were closed with a 4-0 Monocryl in a subcuticular fashion followed by the application of benzoin, steri-strips, and bandaids. The patient was extubated and taken to the recovery room in stable condition. All needle, instrument, and sponge counts were correct x2. There are no immediate complications  (2) 60 mm purple  (4) 60 mm orange  PLAN OF CARE: Admit to inpatient   PATIENT DISPOSITION:  PACU - hemodynamically stable.   Delay start of Pharmacological VTE agent (>24hrs) due to surgical blood loss or risk of bleeding:  no  Mary SellaEric M. Andrey CampanileWilson, MD, FACS  FASMBS General, Bariatric, & Minimally Invasive Surgery [facility]

## 2018-10-02 NOTE — Anesthesia Postprocedure Evaluation (Signed)
Anesthesia Post Note  Patient: Emily Li  Procedure(s) Performed: LAPAROSCOPIC GASTRIC SLEEVE RESECTION, Upper Endo, ERAS Pathway (N/A )     Patient location during evaluation: PACU Anesthesia Type: General Level of consciousness: sedated, oriented and patient cooperative Pain management: pain level controlled Vital Signs Assessment: post-procedure vital signs reviewed and stable Respiratory status: spontaneous breathing, nonlabored ventilation and respiratory function stable Cardiovascular status: blood pressure returned to baseline and stable : nausea improved. Anesthetic complications: no    Last Vitals:  Vitals:   10/02/18 1730 10/02/18 1745  BP: 126/72 121/67  Pulse: 67 67  Resp: 13 12  Temp:  (!) 36.4 C  SpO2: 100% 99%    Last Pain:  Vitals:   10/02/18 1730  TempSrc:   PainSc: 0-No pain                 Jeronica Stlouis,E. Marria Mathison

## 2018-10-02 NOTE — Discharge Instructions (Signed)
° ° ° °GASTRIC BYPASS/SLEEVE ° Home Care Instructions ° ° These instructions are to help you care for yourself when you go home. ° °Call: If you have any problems. °• Call [phone] and ask for the surgeon on call °• If you need immediate help, come to the ER at [facility].  °• Tell the ER staff that you are a new post-op gastric bypass or gastric sleeve patient °  °Signs and symptoms to report: • Severe vomiting or nausea °o If you cannot keep down clear liquids for longer than 1 day, call your surgeon  °• Abdominal pain that does not get better after taking your pain medication °• Fever over 100.4° F with chills °• Heart beating over 100 beats a minute °• Shortness of breath at rest °• Chest pain °•  Redness, swelling, drainage, or foul odor at incision (surgical) sites °•  If your incisions open or pull apart °• Swelling or pain in calf (lower leg) °• Diarrhea (Loose bowel movements that happen often), frequent watery, uncontrolled bowel movements °• Constipation, (no bowel movements for 3 days) if this happens: Pick one °o Milk of Magnesia, 2 tablespoons by mouth, 3 times a day for 2 days if needed °o Stop taking Milk of Magnesia once you have a bowel movement °o Call your doctor if constipation continues °Or °o Miralax  (instead of Milk of Magnesia) following the label instructions °o Stop taking Miralax once you have a bowel movement °o Call your doctor if constipation continues °• Anything you think is not normal °  °Normal side effects after surgery: • Unable to sleep at night or unable to focus °• Irritability or moody °• Being tearful (crying) or depressed °These are common complaints, possibly related to your anesthesia medications that put you to sleep, stress of surgery, and change in lifestyle.  This usually goes away a few weeks after surgery.  If these feelings continue, call your primary care doctor. °  °Wound Care: You may have surgical glue, steri-strips, or staples over your incisions after  surgery °• Surgical glue:  Looks like a clear film over your incisions and will wear off a little at a time °• Steri-strips: Strips of tape over your incisions. You may notice a yellowish color on the skin under the steri-strips. This is used to make the   steri-strips stick better. Do not pull the steri-strips off - let them fall off °• Staples: Staples may be removed before you leave the hospital °o If you go home with staples, call [facility], [phone] at for an appointment with your surgeon’s nurse to have staples removed 10 days after surgery. °• Showering: You may shower two (2) days after your surgery unless your surgeon tells you differently °o Wash gently around incisions with warm soapy water, rinse well, and gently pat dry  °o No tub baths until staples are removed, steri-strips fall off or glue is gone.  °  °Medications: • Medications should be liquid or crushed if larger than the size of a dime °• Extended release pills (medication that release a little bit at a time through the day) should NOT be crushed or cut. (examples include XL, ER, DR, SR) °• Depending on the size and number of medications you take, you may need to space (take a few throughout the day)/change the time you take your medications so that you do not over-fill your pouch (smaller stomach) °• Make sure you follow-up with your primary care doctor to   make medication changes needed during rapid weight loss and life-style changes °• If you have diabetes, follow up with the doctor that orders your diabetes medication(s) within one week after surgery and check your blood sugar regularly. °• Do not drive while taking prescription pain medication  °• It is ok to take Tylenol by the bottle instructions with your pain medicine or instead of your pain medicine as needed.  DO NOT TAKE NSAIDS (EXAMPLES OF NSAIDS:  IBUPROFREN/ NAPROXEN)  °Diet:                    First 2 Weeks ° You will see the dietician t about two (2) weeks  after your surgery. The dietician will increase the types of foods you can eat if you are handling liquids well: °• If you have severe vomiting or nausea and cannot keep down clear liquids lasting longer than 1 day, call your surgeon @ ([phone]) °Protein Shake °• Drink at least 2 ounces of shake 5-6 times per day °• Each serving of protein shakes (usually 8 - 12 ounces) should have: °o 15 grams of protein  °o And no more than 5 grams of carbohydrate  °• Goal for protein each day: °o Men = 80 grams per day °o Women = 60 grams per day °• Protein powder may be added to fluids such as non-fat milk or Lactaid milk or unsweetened Soy/Almond milk (limit to 35 grams added protein powder per serving) ° °Hydration °• Slowly increase the amount of water and other clear liquids as tolerated (See Acceptable Fluids) °• Slowly increase the amount of protein shake as tolerated  °•  Sip fluids slowly and throughout the day.  Do not use straws. °• May use sugar substitutes in small amounts (no more than 6 - 8 packets per day; i.e. Splenda) ° °Fluid Goal °• The first goal is to drink at least 8 ounces of protein shake/drink per day (or as directed by the nutritionist); some examples of protein shakes are Syntrax Nectar, Adkins Advantage, EAS Edge HP, and Unjury. See handout from pre-op Bariatric Education Class: °o Slowly increase the amount of protein shake you drink as tolerated °o You may find it easier to slowly sip shakes throughout the day °o It is important to get your proteins in first °• Your fluid goal is to drink 64 - 100 ounces of fluid daily °o It may take a few weeks to build up to this °• 32 oz (or more) should be clear liquids  °And  °• 32 oz (or more) should be full liquids (see below for examples) °• Liquids should not contain sugar, caffeine, or carbonation ° °Clear Liquids: °• Water or Sugar-free flavored water (i.e. Fruit H2O, Propel) °• Decaffeinated coffee or tea (sugar-free) °• Crystal Lite, Wyler’s Lite,  Minute Maid Lite °• Sugar-free Jell-O °• Bouillon or broth °• Sugar-free Popsicle:   *Less than 20 calories each; Limit 1 per day ° °Full Liquids: °Protein Shakes/Drinks + 2 choices per day of other full liquids °• Full liquids must be: °o No More Than 15 grams of Carbs per serving  °o No More Than 3 grams of Fat per serving °• Strained low-fat cream soup (except Cream of Potato or Tomato) °• Non-Fat milk °• Fat-free Lactaid Milk °• Unsweetened Soy Or Unsweetened Almond Milk °• Low Sugar yogurt (Dannon Lite & Fit, Greek yogurt; Oikos Triple Zero; Chobani Simply 100; Yoplait 100 calorie Greek - No Fruit on the Bottom) ° °  °Vitamins   and Minerals • Start 1 day after surgery unless otherwise directed by your surgeon °• 2 Chewable Bariatric Specific Multivitamin / Multimineral Supplement with iron (Example: Bariatric Advantage Multi EA) °• Chewable Calcium with Vitamin D-3 °(Example: 3 Chewable Calcium Plus 600 with Vitamin D-3) °o Take 500 mg three (3) times a day for a total of 1500 mg each day °o Do not take all 3 doses of calcium at one time as it may cause constipation, and you can only absorb 500 mg  at a time  °o Do not mix multivitamins containing iron with calcium supplements; take 2 hours apart °• Menstruating women and those with a history of anemia (a blood disease that causes weakness) may need extra iron °o Talk with your doctor to see if you need more iron °• Do not stop taking or change any vitamins or minerals until you talk to your dietitian or surgeon °• Your Dietitian and/or surgeon must approve all vitamin and mineral supplements °  °Activity and Exercise: Limit your physical activity as instructed by your doctor.  It is important to continue walking at home.  During this time, use these guidelines: °• Do not lift anything greater than ten (10) pounds for at least two (2) weeks °• Do not go back to work or drive until your surgeon says you can °• You may have sex when you feel comfortable  °o It is  VERY important for female patients to use a reliable birth control method; fertility often increases after surgery  °o All hormonal birth control will be ineffective for 30 days after surgery due to medications given during surgery a barrier method must be used. °o Do not get pregnant for at least 18 months °• Start exercising as soon as your doctor tells you that you can °o Make sure your doctor approves any physical activity °• Start with a simple walking program °• Walk 5-15 minutes each day, 7 days per week.  °• Slowly increase until you are walking 30-45 minutes per day °Consider joining our BELT program. [phone] or email [email or web address] °  °Special Instructions Things to remember: °• Use your CPAP when sleeping if this applies to you ° °• [facility] has two free Bariatric Surgery Support Groups that meet monthly °o The 3rd Thursday of each month, 6 pm, [employer] [facility]  °o The 2nd Friday of each month, 11:45 am in the private dining room in the basement of  °• It is very important to keep all follow up appointments with your surgeon, dietitian, primary care physician, and behavioral health practitioner °• Routine follow up schedule with your surgeon include appointments at 2-3 weeks, 6-8 weeks, 6 months, and 1 year at a minimum.  Your surgeon may request to see you more often.   °o After the first year, please follow up with your bariatric surgeon and dietitian at least once a year in order to maintain best weight loss results °[facility]: [phone] °[facility]: [phone] °Bariatric Nurse Coordinator: [phone] °  °   Reviewed and Endorsed  °by [facility] Patient Education Committee, June, 2016 °Edits Approved: Aug, 2018 ° ° ° °

## 2018-10-02 NOTE — Anesthesia Procedure Notes (Signed)
Procedure Name: Intubation Date/Time: 10/02/2018 2:14 PM Performed by: Eben Burow, CRNA Pre-anesthesia Checklist: Patient identified, Emergency Drugs available, Suction available, Patient being monitored and Timeout performed Patient Re-evaluated:Patient Re-evaluated prior to induction Oxygen Delivery Method: Circle system utilized Preoxygenation: Pre-oxygenation with 100% oxygen Induction Type: IV induction Ventilation: Mask ventilation without difficulty Laryngoscope Size: Mac and 4 Grade View: Grade I Tube type: Oral Tube size: 7.0 mm Number of attempts: 1 Airway Equipment and Method: Stylet Placement Confirmation: ETT inserted through vocal cords under direct vision,  positive ETCO2 and breath sounds checked- equal and bilateral Secured at: 21 cm Tube secured with: Tape Dental Injury: Teeth and Oropharynx as per pre-operative assessment

## 2018-10-03 ENCOUNTER — Other Ambulatory Visit: Payer: Self-pay

## 2018-10-03 ENCOUNTER — Encounter (HOSPITAL_COMMUNITY): Payer: Self-pay | Admitting: General Surgery

## 2018-10-03 LAB — CBC WITH DIFFERENTIAL/PLATELET
Abs Immature Granulocytes: 0.03 10*3/uL (ref 0.00–0.07)
Basophils Absolute: 0 10*3/uL (ref 0.0–0.1)
Basophils Relative: 0 %
Eosinophils Absolute: 0 10*3/uL (ref 0.0–0.5)
Eosinophils Relative: 0 %
HCT: 47.4 % — ABNORMAL HIGH (ref 36.0–46.0)
Hemoglobin: 15.2 g/dL — ABNORMAL HIGH (ref 12.0–15.0)
Immature Granulocytes: 0 %
Lymphocytes Relative: 11 %
Lymphs Abs: 1 10*3/uL (ref 0.7–4.0)
MCH: 30.7 pg (ref 26.0–34.0)
MCHC: 32.1 g/dL (ref 30.0–36.0)
MCV: 95.8 fL (ref 80.0–100.0)
Monocytes Absolute: 0.3 10*3/uL (ref 0.1–1.0)
Monocytes Relative: 4 %
Neutro Abs: 7.4 10*3/uL (ref 1.7–7.7)
Neutrophils Relative %: 85 %
Platelets: 128 10*3/uL — ABNORMAL LOW (ref 150–400)
RBC: 4.95 MIL/uL (ref 3.87–5.11)
RDW: 12.9 % (ref 11.5–15.5)
WBC: 8.7 10*3/uL (ref 4.0–10.5)
nRBC: 0 % (ref 0.0–0.2)

## 2018-10-03 LAB — COMPREHENSIVE METABOLIC PANEL
ALT: 20 U/L (ref 0–44)
AST: 26 U/L (ref 15–41)
Albumin: 3.5 g/dL (ref 3.5–5.0)
Alkaline Phosphatase: 38 U/L (ref 38–126)
Anion gap: 12 (ref 5–15)
BUN: 9 mg/dL (ref 6–20)
CO2: 21 mmol/L — ABNORMAL LOW (ref 22–32)
Calcium: 8.5 mg/dL — ABNORMAL LOW (ref 8.9–10.3)
Chloride: 107 mmol/L (ref 98–111)
Creatinine, Ser: 0.72 mg/dL (ref 0.44–1.00)
GFR calc Af Amer: >60 mL/min (ref >60)
GFR calc non Af Amer: >60 mL/min (ref >60)
Glucose, Bld: 136 mg/dL — ABNORMAL HIGH (ref 70–99)
Potassium: 4.8 mmol/L (ref 3.5–5.1)
Sodium: 140 mmol/L (ref 135–145)
Total Bilirubin: 0.5 mg/dL (ref 0.3–1.2)
Total Protein: 6.7 g/dL (ref 6.5–8.1)

## 2018-10-03 NOTE — Progress Notes (Signed)
1 Day Post-Op   Subjective/Chief Complaint: Threw up liquid tylenol O/w pain ok Learning that I have to sip Still on water    Objective: Vital signs in last 24 hours: Temp:  [97.5 F (36.4 C)-98.6 F (37 C)] 98.6 F (37 C) (09/02 0526) Pulse Rate:  [57-87] 75 (09/02 0526) Resp:  [11-24] 20 (09/02 0526) BP: (111-147)/(60-112) 123/63 (09/02 0526) SpO2:  [94 %-100 %] 99 % (09/02 0526) Weight:  [132.2 kg] 132.2 kg (09/01 1216) Last BM Date: 10/02/18  Intake/Output from previous day: 09/01 0701 - 09/02 0700 In: 3293.4 [P.O.:270; I.V.:3023.4] Out: 770 [Urine:750; Blood:20] Intake/Output this shift: No intake/output data recorded.  Alert, nontoxic, sitting in BS chair cta Reg Soft, mild expected TTP, incisions ok No edema, +SCDs  Lab Results:  Recent Labs    10/01/18 0938 10/02/18 1636 10/03/18 0356  WBC 6.6  --  8.7  HGB 13.9 13.5 15.2*  HCT 44.0 44.1 47.4*  PLT 182  --  128*   BMET Recent Labs    10/01/18 0938  NA 140  K 4.8  CL 108  CO2 25  GLUCOSE 84  BUN 19  CREATININE 0.67  CALCIUM 9.0   PT/INR No results for input(s): LABPROT, INR in the last 72 hours. ABG No results for input(s): PHART, HCO3 in the last 72 hours.  Invalid input(s): PCO2, PO2  Studies/Results: No results found.  Anti-infectives: Anti-infectives (From admission, onward)   Start     Dose/Rate Route Frequency Ordered Stop   10/03/18 1000  hydroxychloroquine (PLAQUENIL) tablet 400 mg     400 mg Oral Daily 10/02/18 2005     10/03/18 0600  cefoTEtan (CEFOTAN) 2 g in sodium chloride 0.9 % 100 mL IVPB     2 g 200 mL/hr over 30 Minutes Intravenous On call to O.R. 10/02/18 1212 10/02/18 1414      Assessment/Plan: s/p Procedure(s): LAPAROSCOPIC GASTRIC SLEEVE RESECTION, Upper Endo, ERAS Pathway (N/A) Principal Problem:   Severe obesity (BMI >= 40) (H[facility]) Active Problems:   Anxiety and depression   Bilateral knee pain   Lupus ([place])   Plantar fasciitis   S/P laparoscopic  sleeve gastrectomy  Doing well. No fever. No wbc. No tachycardia. No clinical signs of leak Cont postop diet as tolerated Cont chemical vte prophylaxis Will see how progresses with oral intake.  LOS: 1 day    Greer Pickerel 10/03/2018

## 2018-10-03 NOTE — Progress Notes (Signed)
Patient alert and oriented, Post op day 1.  Provided support and encouragement.  Encouraged pulmonary toilet, ambulation and small sips of liquids.  Completed 5th cup of water (10 ounces).  Denies nausea/pain. All questions answered.  Will continue to monitor.

## 2018-10-03 NOTE — Progress Notes (Signed)
Patient slow to drink fluids, started protein developed nausea.  Switched to [employer] unjury which worked better but still not meeting fluid requirements for discharge.

## 2018-10-03 NOTE — Progress Notes (Signed)
Nutrition Note  RD consulted for diet education for patient s/p bariatric surgery. While RDs are working remotely, Bariatric nurse coordinator providing education.   If nutrition issues arise, please consult RD.   Zanya Lindo, MS, RD, LDN [place] [facility] Clinical Dietitian Pager: [phone] After Hours Pager: [phone]   

## 2018-10-03 NOTE — Progress Notes (Signed)
Started protein. 

## 2018-10-04 LAB — CBC WITH DIFFERENTIAL/PLATELET
Abs Immature Granulocytes: 0.01 10*3/uL (ref 0.00–0.07)
Basophils Absolute: 0 10*3/uL (ref 0.0–0.1)
Basophils Relative: 0 %
Eosinophils Absolute: 0.1 10*3/uL (ref 0.0–0.5)
Eosinophils Relative: 1 %
HCT: 40.2 % (ref 36.0–46.0)
Hemoglobin: 12.4 g/dL (ref 12.0–15.0)
Immature Granulocytes: 0 %
Lymphocytes Relative: 34 %
Lymphs Abs: 2.8 10*3/uL (ref 0.7–4.0)
MCH: 30.2 pg (ref 26.0–34.0)
MCHC: 30.8 g/dL (ref 30.0–36.0)
MCV: 97.8 fL (ref 80.0–100.0)
Monocytes Absolute: 0.5 10*3/uL (ref 0.1–1.0)
Monocytes Relative: 5 %
Neutro Abs: 4.9 10*3/uL (ref 1.7–7.7)
Neutrophils Relative %: 60 %
Platelets: 154 10*3/uL (ref 150–400)
RBC: 4.11 MIL/uL (ref 3.87–5.11)
RDW: 13.2 % (ref 11.5–15.5)
WBC: 8.3 10*3/uL (ref 4.0–10.5)
nRBC: 0 % (ref 0.0–0.2)

## 2018-10-04 MED ORDER — ACETAMINOPHEN 500 MG PO TABS: 1000.0000 mg | ORAL_TABLET | Freq: Three times a day (TID) | ORAL | 0 refills | Status: AC

## 2018-10-04 MED ORDER — ONDANSETRON 4 MG PO TBDP: 4.0000 mg | ORAL_TABLET | Freq: Four times a day (QID) | ORAL | 0 refills | Status: DC | PRN

## 2018-10-04 MED ORDER — TRAMADOL HCL 50 MG PO TABS: 50.0000 mg | ORAL_TABLET | Freq: Four times a day (QID) | ORAL | 0 refills | Status: DC | PRN

## 2018-10-04 MED ORDER — GABAPENTIN 100 MG PO CAPS: 200.0000 mg | ORAL_CAPSULE | Freq: Two times a day (BID) | ORAL | 0 refills | Status: DC

## 2018-10-04 MED ORDER — PANTOPRAZOLE SODIUM 40 MG PO TBEC: 40.0000 mg | DELAYED_RELEASE_TABLET | Freq: Every day | ORAL | 0 refills | Status: DC

## 2018-10-04 MED FILL — PANTOPRAZOLE SOD DR 40 MG T: 40 | 90 days supply | Qty: 90 | Fill #0

## 2018-10-04 MED FILL — ONDANSETRON ODT 4 MG TABLET: 4 | 5 days supply | Qty: 20 | Fill #0

## 2018-10-04 MED FILL — traMADol HCL 50 MG TABS: 50 | 3 days supply | Qty: 10 | Fill #0

## 2018-10-04 MED FILL — GABAPENTIN 100 MG CAPSULE: 100 | 5 days supply | Qty: 20 | Fill #0

## 2018-10-04 NOTE — Discharge Summary (Signed)
Physician Discharge Summary  Emily Li SHF:[id number] DOB: 10/14/1975 DOA: 10/02/2018  PCP: Hoyt Koch, MD  Admit date: 10/02/2018 Discharge date: 10/04/2018  Recommendations for Outpatient Follow-up:   Follow-up Information    Greer Pickerel, MD. Go on 10/25/2018.   Specialty: General Surgery Why: at 415 pm Contact information: [address] [place] [address] [phone]        Carlena Hurl, PA-C. Go on 11/23/2018.   Specialty: General Surgery Why: at 9 am Contact information: [place] [address] [phone]          Discharge Diagnoses:  Principal Problem:   Severe obesity (BMI >= 40) (HCC) Active Problems:   Anxiety and depression   Bilateral knee pain   Lupus ([place])   Plantar fasciitis   S/P laparoscopic sleeve gastrectomy   Surgical Procedure: Laparoscopic Sleeve Gastrectomy, upper endoscopy  Discharge Condition: Good Disposition: Home  Diet recommendation: Postoperative sleeve gastrectomy diet (liquids only)  Filed Weights   10/02/18 1216  Weight: 132.2 kg     Hospital Course:  The patient was admitted for a planned laparoscopic sleeve gastrectomy. Please see operative note. Preoperatively the patient was given 5000 units of subcutaneous heparin for DVT prophylaxis. Postoperative prophylactic Lovenox dosing was started on the evening of postoperative day 0. ERAS protocol was used. On the evening of postoperative day 0, the patient was started on water and ice chips. On postoperative day 1 the patient had no fever or tachycardia and was tolerating water in their diet was gradually advanced throughout the day.  However the patient's oral intake was not sufficient for safe discharge on postoperative day 1.  On postoperative day 2, the patient was ambulating without difficulty. Their vital signs are stable without fever or tachycardia. Their hemoglobin had remained stable.  Her oral intake improved and she  was felt stable for discharge. The patient had received discharge instructions and counseling. They were deemed stable for discharge and had met discharge criteria  BP 130/72 (BP Location: Right Arm)   Pulse 70   Temp 98.2 F (36.8 C)   Resp 20   Ht '5\' 6"'  (1.676 m)   Wt 132.2 kg   LMP 09/27/2018   SpO2 98%   BMI 47.03 kg/m   Gen: alert, NAD, non-toxic appearing Pupils: equal, no scleral icterus Pulm: Lungs clear to auscultation, symmetric chest rise CV: regular rate and rhythm Abd: soft, mild approp tender, nondistended.  No cellulitis. No incisional hernia Ext: no edema, no calf tenderness Skin: no rash, no jaundice  Discharge Instructions  Discharge Instructions    Ambulate hourly while awake   Complete by: As directed    Call MD for:  difficulty breathing, headache or visual disturbances   Complete by: As directed    Call MD for:  persistant dizziness or light-headedness   Complete by: As directed    Call MD for:  persistant nausea and vomiting   Complete by: As directed    Call MD for:  redness, tenderness, or signs of infection (pain, swelling, redness, odor or green/yellow discharge around incision site)   Complete by: As directed    Call MD for:  severe uncontrolled pain   Complete by: As directed    Call MD for:  temperature >101 F   Complete by: As directed    Diet bariatric full liquid   Complete by: As directed    Discharge instructions   Complete by: As directed  See bariatric discharge instructions   Incentive spirometry   Complete by: As directed    Perform hourly while awake     Allergies as of 10/04/2018      Reactions   Latex Shortness Of Breath, Rash   Anectine [succinylcholine Chloride] Other (See Comments)   Slowly metabolized;        Medication List    TAKE these medications   acetaminophen 500 MG tablet Commonly known as: TYLENOL Take 2 tablets (1,000 mg total) by mouth every 8 (eight) hours for 5 days.   ALPRAZolam 0.25 MG  tablet Commonly known as: XANAX Take 1 tablet (0.25 mg total) by mouth 2 (two) times daily as needed. What changed: reasons to take this   DULoxetine 60 MG capsule Commonly known as: CYMBALTA Take 1 capsule (60 mg total) by mouth daily. Need office visit for further refills   gabapentin 100 MG capsule Commonly known as: NEURONTIN Take 2 capsules (200 mg total) by mouth every 12 (twelve) hours.   hydrocortisone 2.5 % rectal cream Commonly known as: Anusol-HC Place 1 application rectally 2 (two) times daily. What changed:   when to take this  reasons to take this   hydroxychloroquine 200 MG tablet Commonly known as: PLAQUENIL Take 400 mg by mouth daily.   ondansetron 4 MG disintegrating tablet Commonly known as: ZOFRAN-ODT Take 1 tablet (4 mg total) by mouth every 6 (six) hours as needed for nausea or vomiting.   pantoprazole 40 MG tablet Commonly known as: PROTONIX Take 1 tablet (40 mg total) by mouth daily.   traMADol 50 MG tablet Commonly known as: ULTRAM Take 1 tablet (50 mg total) by mouth every 6 (six) hours as needed (pain).      Follow-up Information    Greer Pickerel, MD. Go on 10/25/2018.   Specialty: General Surgery Why: at 415 pm Contact information: [address] [place] [address] [phone]        Carlena Hurl, PA-C. Go on 11/23/2018.   Specialty: General Surgery Why: at 9 am Contact information: [place] [address] [phone]            The results of significant diagnostics from this hospitalization (including imaging, microbiology, ancillary and laboratory) are listed below for reference.    Significant Diagnostic Studies: No results found.  Labs: Basic Metabolic Panel: Recent Labs  Lab 10/01/18 0938 10/03/18 0356  NA 140 140  K 4.8 4.8  CL 108 107  CO2 25 21*  GLUCOSE 84 136*  BUN 19 9  CREATININE 0.67 0.72  CALCIUM 9.0 8.5*   Liver Function Tests: Recent Labs  Lab  10/01/18 0938 10/03/18 0356  AST 16 26  ALT 17 20  ALKPHOS 41 38  BILITOT 0.6 0.5  PROT 7.1 6.7  ALBUMIN 3.8 3.5    CBC: Recent Labs  Lab 10/01/18 0938 10/02/18 1636 10/03/18 0356 10/04/18 0345  WBC 6.6  --  8.7 8.3  NEUTROABS 4.1  --  7.4 4.9  HGB 13.9 13.5 15.2* 12.4  HCT 44.0 44.1 47.4* 40.2  MCV 96.9  --  95.8 97.8  PLT 182  --  128* 154    CBG: No results for input(s): GLUCAP in the last 168 hours.  Principal Problem:   Severe obesity (BMI >= 40) (HCC) Active Problems:   Anxiety and depression   Bilateral knee pain   Lupus (HCC)   Plantar fasciitis   S/P laparoscopic sleeve gastrectomy   Time  coordinating discharge: 15 min  Signed:  Gayland Curry, MD [facility], [place] 10/04/2018, 10:48 AM

## 2018-10-04 NOTE — Progress Notes (Signed)
Patient alert and oriented, Post op day 2.  Provided support and encouragement.  Encouraged pulmonary toilet, ambulation and small sips of liquids.  All questions answered.  Will continue to monitor. 

## 2018-10-04 NOTE — Progress Notes (Signed)

## 2018-10-04 NOTE — Progress Notes (Signed)
Discharge and medication instructions reviewed with patient. Questions answered and patient denies further questions. No prescriptions given. Spouse is coming at 1430 to drive patient home. Donne Hazel, RN

## 2018-10-05 ENCOUNTER — Other Ambulatory Visit: Payer: Self-pay | Admitting: *Deleted

## 2018-10-05 ENCOUNTER — Encounter: Payer: Self-pay | Admitting: *Deleted

## 2018-10-05 NOTE — Patient Outreach (Signed)
[facility]) Care Management  10/05/2018  Emily Li 03-Nov-1975 [id number]  Transition of care call/case closure   Referral received: 09/17/18 Initial outreach: 10/01/18 for preoperative call. 10/05/18 for postoperative call Surgery/procedure date: 10/02/18 Insurance: [facility] Choice Plan   Subjective: Initial successful telephone call to patient's preferred number in order to complete transition of care assessment; 2 HIPAA identifiers verified. Explained purpose of call and completed transition of care assessment.  Emily Li states she/he is doing well, denies post-operative problems, says surgical incisions are unremarkable, states surgical pain well managed with Tylenol, tolerating diet, denies bowel or bladder problems.  Spouse is assisting with her recovery.  She denies any ongoing health issues and says she does not need a referral to one of the [place] chronic disease management programs.  She says she does not have the hospital indemnity She says she uses the [facility] outpatient pharmacy.  She denies educational needs related to staying safe during the COVID 19 pandemic.    Objective:  Kandiss had laparoscopic gastric sleeve surgery on 10/02/18 at [facility].  Comorbidities include: Raynaud's disease, hemorrhoids, anxiety and depression, lupus, and obesity She was discharged to home on 10/04/18 without the need for home health services or DME.   Assessment:  Patient voices good understanding of all discharge instructions.  See transition of care flowsheet for assessment details.   Plan:  Reviewed hospital discharge diagnosis of laparoscopic gastric sleeve surgery and treatment plan using hospital discharge instructions, assessing medication adherence, reviewing problems requiring provider notification, and discussing the importance of follow up with surgeon as directed. Reviewed [place]'s announcements that all [place] members will  receive the Healthy Lifestyle Premium rate in 2021. No ongoing care management needs identified so will close case to [place] Management services and route successful outreach letter with [place] Management pamphlet and 24 Hour Nurse Line Magnet to [place] [facility] clinical pool to be mailed to patient's home address.   Barrington Ellison RN,CCM,CDE [place] Management Coordinator Office Phone [phone] Office Fax [phone]

## 2018-10-09 ENCOUNTER — Telehealth (HOSPITAL_COMMUNITY): Payer: Self-pay

## 2018-10-09 NOTE — Telephone Encounter (Signed)
Patient called to discuss post bariatric surgery follow up questions.  See below:   1.  Tell me about your pain and pain management?sore no tramadol only tylenol and gabapentin  2.  Let's talk about fluid intake.  How much total fluid are you taking in?64 ounces of fluid  3.  How much protein have you taken in the last 2 days?60 grams per day   4.  Have you had nausea?  Tell me about when have experienced nausea and what you did to help?denies  5.  Has the frequency or color changed with your urine?urine clear no change from pre op frequency  6.  Tell me what your incisions look like?healing well, pulling pain with larger incision  7.  Have you been passing gas? BM?had BM Saturday,   8.  If a problem or question were to arise who would you call?  Do you know contact numbers for [place], [facility], and [facility]?  9.  How has the walking going?walking around regularly  10.  How are your vitamins and calcium going?  How are you taking them?MVI started and Calcium x2  Patient had questions regarding DVT as she has a bug bite on her shin present before surgery.  Also had a friend with recent surgery that was placed on Lovenox and wanted further education.  Did not require lovenox per risk calculator.  Patient denies  symptoms and reminded should she experience these symptoms to call [facility] at [phone]

## 2018-10-16 ENCOUNTER — Other Ambulatory Visit: Payer: Self-pay

## 2018-10-16 ENCOUNTER — Encounter: Payer: Self-pay | Admitting: Dietician

## 2018-10-16 ENCOUNTER — Encounter: Payer: 59 | Attending: General Surgery | Admitting: Dietician

## 2018-10-16 DIAGNOSIS — E669 Obesity, unspecified: Secondary | ICD-10-CM | POA: Insufficient documentation

## 2018-10-16 NOTE — Progress Notes (Signed)
Bariatric Class  Start Time: 3:30pm   End Time: 4:05pm  2 Week Post-Operative Nutrition Class  Patient was seen on 10/16/2018 for post-operative nutrition education at [facility]   Surgery date: 10/02/2018 Surgery type: Sleeve Gastrectomy Start weight at [facility]: 301.9 lbs (date: 06/05/2018) Weight today: 277.2 lbs Weight change: -24.7 lbs  Body Composition Scale 10/16/2018  Weight (lbs) 277.2  BMI 44.7  Total Body Fat %      Visceral Fat   Fat-Free Mass %      Total Body Water %      Muscle-Mass (lbs)   Body Fat Displacement ---         Torso  (lbs)          Left Leg  (lbs)          Right Leg  (lbs)          Left Arm  (lbs)          Right Arm  (lbs)    Fluids include water, Ensure Max, cream of chicken Fluid intake: 70-80 oz/day   The following the learning objectives were met by the patient during this course:  Identifies Phase 3 (Soft, High Protein Foods) Dietary Goals and will begin from 2 weeks post-operatively to 2 months post-operatively  Identifies appropriate sources of fluids and proteins   States protein recommendations and appropriate sources post-operatively  Identifies the need for appropriate texture modifications, mastication, and bite sizes when consuming solids  Identifies appropriate multivitamin and calcium sources post-operatively  Describes the need for physical activity post-operatively and will follow MD recommendations  States when to call healthcare provider regarding medication questions or post-operative complications  Handouts given during class include:  Phase 3: Soft, High Protein Diet  Phase 3 Meal Ideas  Follow-Up Plan: Patient will follow-up at [facility] in 6 weeks for 2 month post-op nutrition visit for diet advancement per MD.

## 2018-10-22 ENCOUNTER — Telehealth: Payer: Self-pay | Admitting: Dietician

## 2018-10-22 NOTE — Telephone Encounter (Signed)
I spoke with patient via telephone to assess fluid intake and food tolerance since diet advancement to solid protein foods on 10/16/2018.  Patient unavailable, LVM.   Beecher Furio Nicole (Nikki) Liborio Saccente, MS, RD, LDN  

## 2018-10-31 MED FILL — DULoxetine HCL 60 MG CPEP: 60 | 30 days supply | Qty: 30 | Fill #0

## 2018-10-31 MED FILL — PROCTOZONE-HC 2.5 % CREA: 2.5 | 15 days supply | Qty: 30 | Fill #1

## 2018-10-31 MED FILL — HYDROXYCHLOROQUINE SULFATE: 200 | 30 days supply | Qty: 60 | Fill #0

## 2018-11-28 ENCOUNTER — Encounter: Payer: 59 | Attending: General Surgery | Admitting: Dietician

## 2018-11-28 ENCOUNTER — Other Ambulatory Visit: Payer: Self-pay

## 2018-11-28 ENCOUNTER — Encounter: Payer: Self-pay | Admitting: Dietician

## 2018-11-28 DIAGNOSIS — E669 Obesity, unspecified: Secondary | ICD-10-CM | POA: Insufficient documentation

## 2018-11-28 NOTE — Progress Notes (Signed)
Bariatric Nutrition Follow-Up Visit Medical Nutrition Therapy  Appt Start Time: 4:50pm   End Time: 5:15pm  2 Months Post-Operative Sleeve Surgery Surgery Date: 10/02/2018  Pt's Expectations of Surgery/ Goals: weight loss    NUTRITION ASSESSMENT  Anthropometrics  Start weight at [facility]: 301.9 lbs (date: 06/05/2018) Today's weight: 261.2 lbs Weight change: -16 lbs (since previous nutrition visit)  Body Composition Scale 10/16/2018 11/28/2018  Weight  lbs 277.2 261.2  BMI 44.7 42.1  Total Body Fat  %  44.9     Visceral Fat  15  Fat-Free Mass  %  55.1     Total Body Water  %  42     Muscle-Mass  lbs  33.8  Body Fat Displacement --- ---         Torso  lbs  72.7         Left Leg  lbs  14.5         Right Leg  lbs  14.5         Left Arm  lbs  7.2         Right Arm  lbs  7.2   Clinical  Medical hx: obesity, lupus, anxiety, depression  Medications: see list    Lifestyle & Dietary Hx Works second shift as a Occupational psychologist. Lives with her husband. Patient is kind and personable.  States she has overall done well with tolerating foods/fluids. Only had issues with beef, stating it feels like it "gets stuck." Likes tofu, chicken, yogurt, etc.  States she is learning to "trust the process" for what the future holds as far as weight fluctuations and stalls. States she is learning to listen to her body and what it needs, eating when she is hungry and stopping when full.   Estimated daily fluid intake: 64+ oz Estimated daily protein intake: 60+ g Supplements: Bariatric Advantage MVI chewable, calcium 3x  Current average weekly physical activity: walking, interested in starting BELT    Post-Op Goals/ Signs/ Symptoms Using straws: no Drinking while eating: no Chewing/swallowing difficulties: no Changes in vision: no Changes to mood/headaches: no Hair loss/changes to skin/nails: no Difficulty focusing/concentrating: no Sweating: no Dizziness/lightheadedness: no Palpitations: no   Carbonated/caffeinated beverages: no N/V/D/C/Gas: constipation (this has gotten better)  Abdominal pain: no Dumping syndrome: no   NUTRITION DIAGNOSIS  Overweight/obesity ([place]-3.3) related to past poor dietary habits and physical inactivity as evidenced by completed bariatric surgery and following dietary guidelines for continued weight loss and healthy nutrition status.   NUTRITION INTERVENTION Nutrition counseling (C-1) and education (E-2) to facilitate bariatric surgery goals, including: . Diet advancement to the next phase (phase 4) now including non-starchy vegetables  . The importance of consuming adequate calories as well as certain nutrients daily due to the body's need for essential vitamins, minerals, and fats . The importance of daily physical activity and to reach a goal of at least 150 minutes of moderate to vigorous physical activity weekly (or as directed by their physician) due to benefits such as increased musculature and improved lab values  Handouts Provided Include   Phase 4: Protein + Non-Starchy Vegetables   Learning Style & Readiness for Change Teaching method utilized: Visual & Auditory  Demonstrated degree of understanding via: Teach Back  Barriers to learning/adherence to lifestyle change: None Identified    MONITORING & EVALUATION Dietary intake, weekly physical activity, body weight, and goals in 4 months.  Next Steps Patient is to follow-up in 4 months for 6 month post-op follow-up.

## 2018-12-06 ENCOUNTER — Other Ambulatory Visit: Payer: Self-pay | Admitting: Internal Medicine

## 2018-12-06 MED FILL — HYDROXYCHLOROQUINE SULFATE: 200 | 30 days supply | Qty: 60 | Fill #1

## 2018-12-07 ENCOUNTER — Encounter: Payer: Self-pay | Admitting: Internal Medicine

## 2018-12-07 ENCOUNTER — Other Ambulatory Visit: Payer: Self-pay

## 2018-12-07 ENCOUNTER — Ambulatory Visit (INDEPENDENT_AMBULATORY_CARE_PROVIDER_SITE_OTHER): Payer: 59 | Admitting: Internal Medicine

## 2018-12-07 VITALS — BP 100/70 | HR 73 | Temp 98.2°F | Ht 66.0 in | Wt 262.0 lb

## 2018-12-07 DIAGNOSIS — F329 Major depressive disorder, single episode, unspecified: Secondary | ICD-10-CM | POA: Diagnosis not present

## 2018-12-07 DIAGNOSIS — M329 Systemic lupus erythematosus, unspecified: Secondary | ICD-10-CM

## 2018-12-07 DIAGNOSIS — Z9884 Bariatric surgery status: Secondary | ICD-10-CM

## 2018-12-07 DIAGNOSIS — Z0000 Encounter for general adult medical examination without abnormal findings: Secondary | ICD-10-CM | POA: Diagnosis not present

## 2018-12-07 DIAGNOSIS — F419 Anxiety disorder, unspecified: Secondary | ICD-10-CM | POA: Diagnosis not present

## 2018-12-07 MED ORDER — DULOXETINE HCL 60 MG PO CPEP: 60.0000 mg | ORAL_CAPSULE | Freq: Every day | ORAL | 3 refills | Status: DC

## 2018-12-07 MED FILL — DULoxetine HCL 60 MG CPEP: 60 | 90 days supply | Qty: 90 | Fill #0

## 2018-12-07 NOTE — Assessment & Plan Note (Signed)
Weight coming down significantly after surgery appropriately and she is feeling better.

## 2018-12-07 NOTE — Assessment & Plan Note (Signed)
Flu shot up to date. Tetanus up to date. Mammogram up to date, pap smear up to date. Counseled about sun safety and mole surveillance. Counseled about the dangers of distracted driving. Given 10 year screening recommendations.   

## 2018-12-07 NOTE — Progress Notes (Signed)
   Subjective:   Patient ID: Emily Li, female    DOB: 12-26-1975, 43 y.o.   MRN: [id number]  HPI The patient is a 43 YO female coming in for physical.   PMH, [place], social history reviewed and updated  Review of Systems  Constitutional: Negative.   HENT: Negative.   Eyes: Negative.   Respiratory: Negative for cough, chest tightness and shortness of breath.   Cardiovascular: Negative for chest pain, palpitations and leg swelling.  Gastrointestinal: Negative for abdominal distention, abdominal pain, constipation, diarrhea, nausea and vomiting.  Musculoskeletal: Negative.   Skin: Negative.   Neurological: Negative.   Psychiatric/Behavioral: Negative.     Objective:  Physical Exam Constitutional:      Appearance: She is well-developed. She is obese.  HENT:     Head: Normocephalic and atraumatic.  Neck:     Musculoskeletal: Normal range of motion.  Cardiovascular:     Rate and Rhythm: Normal rate and regular rhythm.  Pulmonary:     Effort: Pulmonary effort is normal. No respiratory distress.     Breath sounds: Normal breath sounds. No wheezing or rales.  Abdominal:     General: Bowel sounds are normal. There is no distension.     Palpations: Abdomen is soft.     Tenderness: There is no abdominal tenderness. There is no rebound.  Skin:    General: Skin is warm and dry.  Neurological:     Mental Status: She is alert and oriented to person, place, and time.     Coordination: Coordination normal.     Vitals:   12/07/18 1445  BP: 100/70  Pulse: 73  Temp: 98.2 F (36.8 C)  TempSrc: Oral  SpO2: 97%  Weight: 262 lb (118.8 kg)  Height: 5\' 6"  (1.676 m)    Assessment & Plan:

## 2018-12-07 NOTE — Assessment & Plan Note (Signed)
Taking plaquenil. Seeing rheumatology and reminded about yearly eye exam.

## 2018-12-07 NOTE — Assessment & Plan Note (Signed)
Down about 30 pounds, doing well with diet and nutrition. Taking multivitamin. Still following with surgeon.

## 2018-12-07 NOTE — Assessment & Plan Note (Signed)
Cymbalta doing well.

## 2018-12-07 NOTE — Patient Instructions (Signed)
Health Maintenance, Female Adopting a healthy lifestyle and getting preventive care are important in promoting health and wellness. Ask your health care provider about:  The right schedule for you to have regular tests and exams.  Things you can do on your own to prevent diseases and keep yourself healthy. What should I know about diet, weight, and exercise? Eat a healthy diet   Eat a diet that includes plenty of vegetables, fruits, low-fat dairy products, and lean protein.  Do not eat a lot of foods that are high in solid fats, added sugars, or sodium. Maintain a healthy weight Body mass index (BMI) is used to identify weight problems. It estimates body fat based on height and weight. Your health care provider can help determine your BMI and help you achieve or maintain a healthy weight. Get regular exercise Get regular exercise. This is one of the most important things you can do for your health. Most adults should:  Exercise for at least 150 minutes each week. The exercise should increase your heart rate and make you sweat (moderate-intensity exercise).  Do strengthening exercises at least twice a week. This is in addition to the moderate-intensity exercise.  Spend less time sitting. Even light physical activity can be beneficial. Watch cholesterol and blood lipids Have your blood tested for lipids and cholesterol at 41 years of age, then have this test every 5 years. Have your cholesterol levels checked more often if:  Your lipid or cholesterol levels are high.  You are older than 40 years of age.  You are at high risk for heart disease. What should I know about cancer screening? Depending on your health history and family history, you may need to have cancer screening at various ages. This may include screening for:  Breast cancer.  Cervical cancer.  Colorectal cancer.  Skin cancer.  Lung cancer. What should I know about heart disease, diabetes, and high blood  pressure? Blood pressure and heart disease  High blood pressure causes heart disease and increases the risk of stroke. This is more likely to develop in people who have high blood pressure readings, are of African descent, or are overweight.  Have your blood pressure checked: ? Every 3-5 years if you are 18-39 years of age. ? Every year if you are 40 years old or older. Diabetes Have regular diabetes screenings. This checks your fasting blood sugar level. Have the screening done:  Once every three years after age 40 if you are at a normal weight and have a low risk for diabetes.  More often and at a younger age if you are overweight or have a high risk for diabetes. What should I know about preventing infection? Hepatitis B If you have a higher risk for hepatitis B, you should be screened for this virus. Talk with your health care provider to find out if you are at risk for hepatitis B infection. Hepatitis C Testing is recommended for:  Everyone born from 1945 through 1965.  Anyone with known risk factors for hepatitis C. Sexually transmitted infections (STIs)  Get screened for STIs, including gonorrhea and chlamydia, if: ? You are sexually active and are younger than 41 years of age. ? You are older than 41 years of age and your health care provider tells you that you are at risk for this type of infection. ? Your sexual activity has changed since you were last screened, and you are at increased risk for chlamydia or gonorrhea. Ask your health care provider if   you are at risk.  Ask your health care provider about whether you are at high risk for HIV. Your health care provider may recommend a prescription medicine to help prevent HIV infection. If you choose to take medicine to prevent HIV, you should first get tested for HIV. You should then be tested every 3 months for as long as you are taking the medicine. Pregnancy  If you are about to stop having your period (premenopausal) and  you may become pregnant, seek counseling before you get pregnant.  Take 400 to 800 micrograms (mcg) of folic acid every day if you become pregnant.  Ask for birth control (contraception) if you want to prevent pregnancy. Osteoporosis and menopause Osteoporosis is a disease in which the bones lose minerals and strength with aging. This can result in bone fractures. If you are 65 years old or older, or if you are at risk for osteoporosis and fractures, ask your health care provider if you should:  Be screened for bone loss.  Take a calcium or vitamin D supplement to lower your risk of fractures.  Be given hormone replacement therapy (HRT) to treat symptoms of menopause. Follow these instructions at home: Lifestyle  Do not use any products that contain nicotine or tobacco, such as cigarettes, e-cigarettes, and chewing tobacco. If you need help quitting, ask your health care provider.  Do not use street drugs.  Do not share needles.  Ask your health care provider for help if you need support or information about quitting drugs. Alcohol use  Do not drink alcohol if: ? Your health care provider tells you not to drink. ? You are pregnant, may be pregnant, or are planning to become pregnant.  If you drink alcohol: ? Limit how much you use to 0-1 drink a day. ? Limit intake if you are breastfeeding.  Be aware of how much alcohol is in your drink. In the [place] one drink equals one 12 oz bottle of beer (355 mL), one 5 oz glass of wine (148 mL), or one 1 oz glass of hard liquor (44 mL). General instructions  Schedule regular health, dental, and eye exams.  Stay current with your vaccines.  Tell your health care provider if: ? You often feel depressed. ? You have ever been abused or do not feel safe at home. Summary  Adopting a healthy lifestyle and getting preventive care are important in promoting health and wellness.  Follow your health care provider's instructions about healthy  diet, exercising, and getting tested or screened for diseases.  Follow your health care provider's instructions on monitoring your cholesterol and blood pressure. This information is not intended to replace advice given to you by your health care provider. Make sure you discuss any questions you have with your health care provider. Document Released: 08/02/2010 Document Revised: 01/10/2018 Document Reviewed: 01/10/2018 Elsevier Patient Education  2020 Elsevier Inc.  

## 2019-01-08 ENCOUNTER — Ambulatory Visit (INDEPENDENT_AMBULATORY_CARE_PROVIDER_SITE_OTHER): Payer: 59 | Admitting: Professional

## 2019-01-08 DIAGNOSIS — F411 Generalized anxiety disorder: Secondary | ICD-10-CM | POA: Diagnosis not present

## 2019-01-08 DIAGNOSIS — F331 Major depressive disorder, recurrent, moderate: Secondary | ICD-10-CM

## 2019-01-14 MED FILL — PROCTOZONE-HC 2.5 % CREA: 2.5 | 15 days supply | Qty: 30 | Fill #2

## 2019-01-14 MED FILL — HYDROXYCHLOROQUINE SULFATE: 200 | 30 days supply | Qty: 60 | Fill #2

## 2019-01-18 ENCOUNTER — Ambulatory Visit (INDEPENDENT_AMBULATORY_CARE_PROVIDER_SITE_OTHER): Payer: 59 | Admitting: Professional

## 2019-01-18 DIAGNOSIS — F411 Generalized anxiety disorder: Secondary | ICD-10-CM | POA: Diagnosis not present

## 2019-01-18 DIAGNOSIS — F331 Major depressive disorder, recurrent, moderate: Secondary | ICD-10-CM

## 2019-02-07 ENCOUNTER — Ambulatory Visit: Payer: 59 | Admitting: Professional

## 2019-02-08 ENCOUNTER — Ambulatory Visit (INDEPENDENT_AMBULATORY_CARE_PROVIDER_SITE_OTHER): Payer: 59 | Admitting: Professional

## 2019-02-08 DIAGNOSIS — F331 Major depressive disorder, recurrent, moderate: Secondary | ICD-10-CM | POA: Diagnosis not present

## 2019-02-08 DIAGNOSIS — F411 Generalized anxiety disorder: Secondary | ICD-10-CM

## 2019-02-14 ENCOUNTER — Ambulatory Visit: Payer: 59 | Admitting: Professional

## 2019-02-15 ENCOUNTER — Ambulatory Visit (INDEPENDENT_AMBULATORY_CARE_PROVIDER_SITE_OTHER): Payer: 59 | Admitting: Professional

## 2019-02-15 DIAGNOSIS — F411 Generalized anxiety disorder: Secondary | ICD-10-CM | POA: Diagnosis not present

## 2019-02-15 DIAGNOSIS — F331 Major depressive disorder, recurrent, moderate: Secondary | ICD-10-CM

## 2019-02-18 ENCOUNTER — Encounter: Payer: Self-pay | Admitting: Internal Medicine

## 2019-02-19 ENCOUNTER — Other Ambulatory Visit: Payer: Self-pay

## 2019-02-19 ENCOUNTER — Ambulatory Visit (INDEPENDENT_AMBULATORY_CARE_PROVIDER_SITE_OTHER): Payer: 59 | Admitting: Family

## 2019-02-19 ENCOUNTER — Encounter: Payer: Self-pay | Admitting: Family

## 2019-02-19 VITALS — BP 112/76 | HR 86 | Temp 98.2°F | Ht 66.0 in | Wt 243.8 lb

## 2019-02-19 DIAGNOSIS — B029 Zoster without complications: Secondary | ICD-10-CM

## 2019-02-19 MED ORDER — VALACYCLOVIR HCL 1 G PO TABS: 1000.0000 mg | ORAL_TABLET | Freq: Three times a day (TID) | ORAL | 0 refills | Status: DC

## 2019-02-19 MED ORDER — GABAPENTIN 100 MG PO CAPS: 200.0000 mg | ORAL_CAPSULE | Freq: Every day | ORAL | 0 refills | Status: DC

## 2019-02-19 MED FILL — GABAPENTIN 100 MG CAPSULE: 100 | 30 days supply | Qty: 60 | Fill #0

## 2019-02-19 MED FILL — valACYclovir HCL 1 GM TABS: 1 | 7 days supply | Qty: 21 | Fill #0

## 2019-02-19 NOTE — Patient Instructions (Signed)
Shingles  Shingles is an infection. It gives you a painful skin rash and blisters that have fluid in them. Shingles is caused by the same germ (virus) that causes chickenpox. Shingles only happens in people who:  Have had chickenpox.  Have been given a shot of medicine (vaccine) to protect against chickenpox. Shingles is rare in this group. The first symptoms of shingles may be itching, tingling, or pain in an area on your skin. A rash will show on your skin a few days or weeks later. The rash is likely to be on one side of your body. The rash usually has a shape like a belt or a band. Over time, the rash turns into fluid-filled blisters. The blisters will break open, change into scabs, and dry up. Medicines may:  Help with pain and itching.  Help you get better sooner.  Help to prevent long-term problems. Follow these instructions at home: Medicines  Take over-the-counter and prescription medicines only as told by your doctor.  Put on an anti-itch cream or numbing cream where you have a rash, blisters, or scabs. Do this as told by your doctor. Helping with itching and discomfort   Put cold, wet cloths (cold compresses) on the area of the rash or blisters as told by your doctor.  Cool baths can help you feel better. Try adding baking soda or dry oatmeal to the water to lessen itching. Do not bathe in hot water. Blister and rash care  Keep your rash covered with a loose bandage (dressing).  Wear loose clothing that does not rub on your rash.  Keep your rash and blisters clean. To do this, wash the area with mild soap and cool water as told by your doctor.  Check your rash every day for signs of infection. Check for: ? More redness, swelling, or pain. ? Fluid or blood. ? Warmth. ? Pus or a bad smell.  Do not scratch your rash. Do not pick at your blisters. To help you to not scratch: ? Keep your fingernails clean and cut short. ? Wear gloves or mittens when you sleep, if  scratching is a problem. General instructions  Rest as told by your doctor.  Keep all follow-up visits as told by your doctor. This is important.  Wash your hands often with soap and water. If soap and water are not available, use hand sanitizer. Doing this lowers your chance of getting a skin infection caused by germs (bacteria).  Your infection can cause chickenpox in people who have never had chickenpox or never got a shot of chickenpox vaccine. If you have blisters that did not change into scabs yet, try not to touch other people or be around other people, especially: ? Babies. ? Pregnant women. ? Children who have areas of red, itchy, or rough skin (eczema). ? Very old people who have transplants. ? People who have a long-term (chronic) sickness, like cancer or AIDS. Contact a doctor if:  Your pain does not get better with medicine.  Your pain does not get better after the rash heals.  You have any signs of infection in the rash area. These signs include: ? More redness, swelling, or pain around the rash. ? Fluid or blood coming from the rash. ? The rash area feeling warm to the touch. ? Pus or a bad smell coming from the rash. Get help right away if:  The rash is on your face or nose.  You have pain in your face or pain by   your eye.  You lose feeling on one side of your face.  You have trouble seeing.  You have ear pain, or you have ringing in your ear.  You have a loss of taste.  Your condition gets worse. Summary  Shingles gives you a painful skin rash and blisters that have fluid in them.  Shingles is an infection. It is caused by the same germ (virus) that causes chickenpox.  Keep your rash covered with a loose bandage (dressing). Wear loose clothing that does not rub on your rash.  If you have blisters that did not change into scabs yet, try not to touch other people or be around people. This information is not intended to replace advice given to you by  your health care provider. Make sure you discuss any questions you have with your health care provider. Document Revised: 05/11/2018 Document Reviewed: 09/21/2016 Elsevier Patient Education  2020 Elsevier Inc.  

## 2019-02-19 NOTE — Progress Notes (Signed)
Emily Li is a 44 y.o. female with the following history as recorded in [employer]:  Patient Active Problem List   Diagnosis Date Noted  . Lupus ([place]) 10/02/2018  . 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 Hemorrhoids 07/13/2018  . Routine general medical examination at a health care facility 08/07/2014  . Severe obesity (BMI >= 40) ([facility])   . Anxiety and depression 10/25/2012    Current Outpatient Medications  Medication Sig Dispense Refill  . ALPRAZolam (XANAX) 0.25 MG tablet Take 1 tablet (0.25 mg total) by mouth 2 (two) times daily as needed. (Patient taking differently: Take 0.25 mg by mouth 2 (two) times daily as needed for anxiety. ) 30 tablet 0  . DULoxetine (CYMBALTA) 60 MG capsule Take 1 capsule (60 mg total) by mouth daily. 90 capsule 3  . hydrocortisone (ANUSOL-HC) 2.5 % rectal cream Place 1 application rectally 2 (two) times daily. (Patient taking differently: Place 1 application rectally 2 (two) times daily as needed for hemorrhoids. ) 30 g 6  . hydroxychloroquine (PLAQUENIL) 200 MG tablet Take 400 mg by mouth daily.  2  . gabapentin (NEURONTIN) 100 MG capsule Take 2 capsules (200 mg total) by mouth at bedtime. 60 capsule 0  . pantoprazole (PROTONIX) 40 MG tablet Take 1 tablet (40 mg total) by mouth daily. (Patient not taking: Reported on 02/19/2019) 90 tablet 0  . valACYclovir (VALTREX) 1000 MG tablet Take 1 tablet (1,000 mg total) by mouth 3 (three) times daily. 21 tablet 0   No current facility-administered medications for this visit.    Allergies: Latex and Anectine [succinylcholine chloride]  Past Medical History:  Diagnosis Date  . Anxiety   . Anxiety and depression   . Chicken pox   . Complication of anesthesia    reports in 2001 with appendix surgery ,  after completeion of case reports she was still under influence of the paralytic used (succinycholine) ,and remained paralyzed for 3-4  hours , and had to remain intubated as well , denies any allergic  raction to paralytic   . Hay fever   . Lupus ([place])    managed on plaquenil , sees rheumatology for mgmt   . Raynaud's disease    age 3  , per patient report under control      Past Surgical History:  Procedure Laterality Date  . APPENDECTOMY  2001  . LAPAROSCOPIC GASTRIC SLEEVE RESECTION N/A 10/02/2018   Procedure: LAPAROSCOPIC GASTRIC SLEEVE RESECTION, Upper Endo, ERAS Pathway;  Surgeon: Greer Pickerel, MD;  Location: [facility];  Service: General;  Laterality: N/A;  . WISDOM TOOTH EXTRACTION      Family History  Problem Relation Age of Onset  . Alcohol abuse Mother   . Hyperlipidemia Mother   . Diabetes Mother   . Alcohol abuse Father   . Cancer Father   . Hyperlipidemia Father   . Diabetes Father   . Arthritis Maternal Grandmother   . Heart disease Maternal Grandmother   . Stroke Maternal Grandmother   . Hypertension Maternal Grandmother   . Arthritis Maternal Grandfather   . Heart disease Maternal Grandfather   . Stroke Maternal Grandfather   . Hypertension Maternal Grandfather   . Cancer Maternal Aunt 54       vaginal ca    Social History   Tobacco Use  . Smoking status: Former Research scientist (life sciences)  . Smokeless tobacco: Never Used  . Tobacco comment: quit 5 years ago   Substance Use Topics  . Alcohol use: Yes  Comment: 3 times a week     Subjective:  Presents with burning, painful rash on back/ under left rib cage; "tender to touch." denies any new soaps, foods, detergents or medications. Started on Saturday with initial outbreak and has now spread around to under left rib;  Objective:  Vitals:   02/19/19 0931  BP: 112/76  Pulse: 86  Temp: 98.2 F (36.8 C)  TempSrc: Oral  SpO2: 99%  Weight: 243 lb 12.8 oz (110.6 kg)  Height: 5\' 6"  (1.676 m)    General: Well developed, well nourished, in no acute distress  Skin : Warm and dry. Vesicular lesions noted on mid back;  Head: Normocephalic and atraumatic  Lungs: Respirations unlabored;  Neurologic: Alert and oriented; speech  intact;  Assessment:  1. Herpes zoster without complication     Plan:  Rx for Valtrex 1 gm tid x 7 days, Gabapentin to use at night; will defer oral steroids at this time due to recent history of sleeve surgery; follow-up to be determined.   This visit occurred during the SARS-CoV-2 public health emergency.  Safety protocols were in place, including screening questions prior to the visit, additional usage of staff PPE, and extensive cleaning of exam room while observing appropriate contact time as indicated for disinfecting solutions.     No follow-ups on file.  No orders of the defined types were placed in this encounter.   Requested Prescriptions   Signed Prescriptions Disp Refills  . valACYclovir (VALTREX) 1000 MG tablet 21 tablet 0    Sig: Take 1 tablet (1,000 mg total) by mouth 3 (three) times daily.  gabapentin (NEURONTIN) 100 MG capsule 60 capsule 0    Sig: Take 2 capsules (200 mg total) by mouth at bedtime.

## 2019-02-21 ENCOUNTER — Encounter: Payer: Self-pay | Admitting: Family

## 2019-02-21 ENCOUNTER — Ambulatory Visit: Payer: 59 | Admitting: Professional

## 2019-02-21 MED FILL — HYDROXYCHLOROQUINE SULFATE: 200 | 30 days supply | Qty: 60 | Fill #3

## 2019-02-28 ENCOUNTER — Ambulatory Visit: Payer: 59 | Admitting: Professional

## 2019-02-28 DIAGNOSIS — R5382 Chronic fatigue, unspecified: Secondary | ICD-10-CM | POA: Diagnosis not present

## 2019-02-28 DIAGNOSIS — I7300 Raynaud's syndrome without gangrene: Secondary | ICD-10-CM | POA: Diagnosis not present

## 2019-02-28 DIAGNOSIS — R635 Abnormal weight gain: Secondary | ICD-10-CM | POA: Diagnosis not present

## 2019-02-28 DIAGNOSIS — L659 Nonscarring hair loss, unspecified: Secondary | ICD-10-CM | POA: Diagnosis not present

## 2019-02-28 DIAGNOSIS — R760 Raised antibody titer: Secondary | ICD-10-CM | POA: Diagnosis not present

## 2019-02-28 DIAGNOSIS — M329 Systemic lupus erythematosus, unspecified: Secondary | ICD-10-CM | POA: Diagnosis not present

## 2019-02-28 DIAGNOSIS — R21 Rash and other nonspecific skin eruption: Secondary | ICD-10-CM | POA: Diagnosis not present

## 2019-03-01 ENCOUNTER — Ambulatory Visit (INDEPENDENT_AMBULATORY_CARE_PROVIDER_SITE_OTHER): Payer: 59 | Admitting: Professional

## 2019-03-01 DIAGNOSIS — F411 Generalized anxiety disorder: Secondary | ICD-10-CM

## 2019-03-01 DIAGNOSIS — F331 Major depressive disorder, recurrent, moderate: Secondary | ICD-10-CM

## 2019-03-05 DIAGNOSIS — E7800 Pure hypercholesterolemia, unspecified: Secondary | ICD-10-CM | POA: Diagnosis not present

## 2019-03-05 DIAGNOSIS — K912 Postsurgical malabsorption, not elsewhere classified: Secondary | ICD-10-CM | POA: Diagnosis not present

## 2019-03-07 ENCOUNTER — Ambulatory Visit: Payer: 59 | Admitting: Professional

## 2019-03-08 ENCOUNTER — Ambulatory Visit (INDEPENDENT_AMBULATORY_CARE_PROVIDER_SITE_OTHER): Payer: 59 | Admitting: Professional

## 2019-03-08 DIAGNOSIS — F411 Generalized anxiety disorder: Secondary | ICD-10-CM | POA: Diagnosis not present

## 2019-03-08 DIAGNOSIS — F331 Major depressive disorder, recurrent, moderate: Secondary | ICD-10-CM

## 2019-03-12 ENCOUNTER — Other Ambulatory Visit: Payer: Self-pay

## 2019-03-12 ENCOUNTER — Encounter: Payer: 59 | Attending: General Surgery | Admitting: Skilled Nursing Facility1

## 2019-03-12 DIAGNOSIS — E669 Obesity, unspecified: Secondary | ICD-10-CM | POA: Insufficient documentation

## 2019-03-13 NOTE — Progress Notes (Signed)
Follow-up visit:  Post-Operative sleeve Surgery  Medical Nutrition Therapy:  Appt start time: 6:00pm end time:  7:00pm  Primary concerns today: Post-operative Bariatric Surgery Nutrition Management 6 Month Post-Op Class  Surgery date: 10/02/2018 Surgery type: Sleeve Gastrectomy Start weight at [facility]: 301.9 lbs (date: 06/05/2018)  Body Composition Scale Date  Weight  lbs 237.8  Total Body Fat  % 42.5     Visceral Fat 13  Fat-Free Mass  % 57.4     Total Body Water  % 43.2     Muscle-Mass  lbs 33.5  BMI 38.3  Body Fat Displacement ---        Torso  lbs 62.6        Left Leg  lbs 12.5        Right Leg  lbs 12.5        Left Arm  lbs 6.2        Right Arm  lbs 6.2     Information Reviewed/ Discussed During Appointment: -Review of composition scale numbers -Fluid requirements (64-100 ounces) -Protein requirements (60-80g) -Strategies for tolerating diet -Advancement of diet to include Starchy vegetables -Barriers to inclusion of new foods -Inclusion of appropriate multivitamin and calcium supplements  -Exercise recommendations   Fluid intake: adequate   Medications: See List Supplementation: appropriate   Using straws: no Drinking while eating: no Having you been chewing well: yes Chewing/swallowing difficulties: no Changes in vision: no Changes to mood/headaches: no Hair loss/Cahnges to skin/Changes to nails: no Any difficulty focusing or concentrating: no Sweating: no Dizziness/Lightheaded: no Palpitations: no  Carbonated beverages: no N/V/D/C/GAS: no Abdominal Pain: no Dumping syndrome: no  Recent physical activity:  ADL's  Progress Towards Goal(s):  In Progress  Handouts given during visit include:  Phase V diet Progression   Goals Sheet  The Benefits of Exercise are endless.....  Support Group Topics  Pt Chosen Goals: Nutrition Goals: I will eat (a number) __1____ new non-starchy vegetables every week by (specific date)  ____04/01/2021__________  Mental Health and Well Being: I will say 2 nice things to and about myself 7 days a week by (specific date) ___03/01/2021___________  Teaching Method Utilized:  Visual Auditory Hands on  Demonstrated degree of understanding via:  Teach Back   Monitoring/Evaluation:  Dietary intake, exercise, and body weight. Follow up in 3 months for 9 month post-op visit.

## 2019-03-14 ENCOUNTER — Ambulatory Visit: Payer: 59 | Admitting: Professional

## 2019-03-15 ENCOUNTER — Ambulatory Visit: Payer: 59 | Admitting: Professional

## 2019-03-19 DIAGNOSIS — Z9884 Bariatric surgery status: Secondary | ICD-10-CM | POA: Diagnosis not present

## 2019-03-22 ENCOUNTER — Ambulatory Visit: Payer: 59 | Admitting: Professional

## 2019-03-27 MED FILL — HYDROXYCHLOROQUINE SULFATE: 200 | 30 days supply | Qty: 60 | Fill #4

## 2019-03-27 MED FILL — DULoxetine HCL 60 MG CPEP: 60 | 90 days supply | Qty: 90 | Fill #1

## 2019-03-29 ENCOUNTER — Ambulatory Visit: Payer: 59 | Admitting: Professional

## 2019-04-04 ENCOUNTER — Ambulatory Visit (INDEPENDENT_AMBULATORY_CARE_PROVIDER_SITE_OTHER): Payer: 59 | Admitting: Professional

## 2019-04-04 DIAGNOSIS — F331 Major depressive disorder, recurrent, moderate: Secondary | ICD-10-CM | POA: Diagnosis not present

## 2019-04-04 DIAGNOSIS — F411 Generalized anxiety disorder: Secondary | ICD-10-CM

## 2019-04-05 ENCOUNTER — Ambulatory Visit: Payer: 59 | Admitting: Professional

## 2019-04-12 ENCOUNTER — Ambulatory Visit: Payer: 59 | Admitting: Professional

## 2019-04-19 ENCOUNTER — Ambulatory Visit: Payer: 59 | Admitting: Professional

## 2019-04-26 ENCOUNTER — Ambulatory Visit: Payer: 59 | Admitting: Professional

## 2019-05-02 ENCOUNTER — Ambulatory Visit (INDEPENDENT_AMBULATORY_CARE_PROVIDER_SITE_OTHER): Payer: 59 | Admitting: Professional

## 2019-05-02 DIAGNOSIS — F411 Generalized anxiety disorder: Secondary | ICD-10-CM | POA: Diagnosis not present

## 2019-05-02 DIAGNOSIS — F331 Major depressive disorder, recurrent, moderate: Secondary | ICD-10-CM

## 2019-05-03 ENCOUNTER — Ambulatory Visit: Payer: 59 | Admitting: Professional

## 2019-05-06 MED FILL — HYDROXYCHLOROQUINE SULFATE: 200 | 90 days supply | Qty: 180 | Fill #0

## 2019-05-10 ENCOUNTER — Ambulatory Visit: Payer: 59 | Admitting: Professional

## 2019-05-15 ENCOUNTER — Ambulatory Visit: Payer: 59 | Admitting: Professional

## 2019-05-17 ENCOUNTER — Ambulatory Visit: Payer: 59 | Admitting: Professional

## 2019-05-21 DIAGNOSIS — H524 Presbyopia: Secondary | ICD-10-CM | POA: Diagnosis not present

## 2019-05-24 ENCOUNTER — Ambulatory Visit: Payer: 59 | Admitting: Professional

## 2019-05-29 ENCOUNTER — Ambulatory Visit (INDEPENDENT_AMBULATORY_CARE_PROVIDER_SITE_OTHER): Payer: 59 | Admitting: Professional

## 2019-05-29 DIAGNOSIS — F331 Major depressive disorder, recurrent, moderate: Secondary | ICD-10-CM

## 2019-05-29 DIAGNOSIS — F411 Generalized anxiety disorder: Secondary | ICD-10-CM

## 2019-05-31 ENCOUNTER — Ambulatory Visit: Payer: 59 | Admitting: Professional

## 2019-06-07 ENCOUNTER — Ambulatory Visit: Payer: 59 | Admitting: Professional

## 2019-06-14 ENCOUNTER — Ambulatory Visit: Payer: 59 | Admitting: Professional

## 2019-06-21 ENCOUNTER — Ambulatory Visit: Payer: 59 | Admitting: Professional

## 2019-06-25 ENCOUNTER — Other Ambulatory Visit: Payer: Self-pay

## 2019-06-25 ENCOUNTER — Encounter: Payer: 59 | Attending: General Surgery | Admitting: Skilled Nursing Facility1

## 2019-06-25 DIAGNOSIS — E669 Obesity, unspecified: Secondary | ICD-10-CM | POA: Diagnosis not present

## 2019-06-25 NOTE — Progress Notes (Signed)
Follow-up visit:  Post-Operative sleeve Surgery   Surgery date: 10/02/2018 Surgery type: Sleeve Gastrectomy Start weight at [facility]: 301.9 lbs (date: 06/05/2018)  Body Composition Scale 03/12/2019 06/25/2019  Weight  lbs 237.8 226.8  Total Body Fat  % 42.5 41.3     Visceral Fat 13 12  Fat-Free Mass  % 57.4 58.6     Total Body Water  % 43.2 43.8     Muscle-Mass  lbs 33.5 33.3  BMI 38.3 36.5  Body Fat Displacement ---         Torso  lbs 62.6 58        Left Leg  lbs 12.5 11.6        Right Leg  lbs 12.5 11.6        Left Arm  lbs 6.2 5.8        Right Arm  lbs 6.2 5.8   Pt states she has not lost enough weight since January. Pt states she is doing [facility]. Pt states her bowel movements are loose stating they are water every other day. Pt states this is the same with or without the protein shake. Pt states she has had diarrhea for the last 4 months.    24 hr recall: every other week eating out  First meal: half protein shake with coffee Snack: 2 hard boiled eggs Lunch: chicken breast with broccoli and cauliflower  Snack: cheese stick wrapped in [place] Dinner: seafood or chicken + green beans and carrots  Beverage: decaff coffee, water + flavoring  Goals: At least half your fluids plain water (35 ounces); aim for 70 ounces  Eat at minimum 1 complex carb serving per day Call your surgeon about your diarrhea  Fluid intake: 64oz   Medications: See List Supplementation: appropriate   Using straws: no Drinking while eating: no Having you been chewing well: yes Chewing/swallowing difficulties: no Changes in vision: no Changes to mood/headaches: no Hair loss/Cahnges to skin/Changes to nails: no Any difficulty focusing or concentrating: no Sweating: no Dizziness/Lightheaded: no Palpitations: no  Carbonated beverages: no N/V/D/C/GAS: no Abdominal Pain: no Dumping syndrome: no  Recent physical activity:  ADL's  Progress Towards Goal(s):  In Progress   Teaching Method Utilized:   Visual Auditory Hands on  Demonstrated degree of understanding via:  Teach Back   Monitoring/Evaluation:  Dietary intake, exercise, and body weight.

## 2019-06-26 ENCOUNTER — Ambulatory Visit (INDEPENDENT_AMBULATORY_CARE_PROVIDER_SITE_OTHER): Payer: 59 | Admitting: Professional

## 2019-06-26 DIAGNOSIS — F331 Major depressive disorder, recurrent, moderate: Secondary | ICD-10-CM | POA: Diagnosis not present

## 2019-06-26 DIAGNOSIS — F411 Generalized anxiety disorder: Secondary | ICD-10-CM | POA: Diagnosis not present

## 2019-06-28 ENCOUNTER — Ambulatory Visit: Payer: 59 | Admitting: Professional

## 2019-07-05 ENCOUNTER — Ambulatory Visit: Payer: 59 | Admitting: Professional

## 2019-07-15 MED FILL — DULoxetine HCL 60 MG CPEP: 60 | 90 days supply | Qty: 90 | Fill #2

## 2019-07-23 ENCOUNTER — Ambulatory Visit: Payer: 59 | Admitting: Professional

## 2019-07-24 ENCOUNTER — Other Ambulatory Visit: Payer: Self-pay

## 2019-07-24 ENCOUNTER — Encounter: Payer: 59 | Attending: General Surgery | Admitting: Skilled Nursing Facility1

## 2019-07-24 DIAGNOSIS — E669 Obesity, unspecified: Secondary | ICD-10-CM | POA: Insufficient documentation

## 2019-07-24 NOTE — Progress Notes (Signed)
Follow-up visit:  Post-Operative sleeve Surgery   Surgery date: 10/02/2018 Surgery type: Sleeve Gastrectomy Start weight at [facility]: 301.9 lbs (date: 06/05/2018)  Body Composition Scale 03/12/2019 06/25/2019 07/24/2019  Weight  lbs 237.8 226.8 224.1  Total Body Fat  % 42.5 41.3 40.9     Visceral Fat 13 12 12   Fat-Free Mass  % 57.4 58.6 59     Total Body Water  % 43.2 43.8 44     Muscle-Mass  lbs 33.5 33.3 33.3  BMI 38.3 36.5 36.1  Body Fat Displacement ---          Torso  lbs 62.6 58 56.8        Left Leg  lbs 12.5 11.6 11.3        Right Leg  lbs 12.5 11.6 11.3        Left Arm  lbs 6.2 5.8 5.6        Right Arm  lbs 6.2 5.8 5.6     Pt states she no longer has any diarrhea stating she thinks it was the water flavorings. Pt states she has increased her water which has been difficult but feels it is very worth it. Pt states she graduated from [facility] and started [facility]. Pt states she does not feel hunger and started seeing a mental health profession to help her with that. Pt states she does feel she has more trust in her body now.   24 hr recall: every other week eating out  First meal: half protein shake with decaff coffee + berries Snack: 1 hard boiled egg Snack: 1 hard boiled egg + fruit Lunch: chicken breast with broccoli and cauliflower or fish + green beans Snack: cheese stick wrapped in or yogurt Dinner: seafood or chicken + green beans and carrots  Beverage: decaff coffee, water + flavoring  Goals: At least half your fluids plain water (35 ounces); aim for 70 ounces    Fluid intake: 64oz   Medications: See List Supplementation: multi and calcium  Using straws: no Drinking while eating: no Having you been chewing well: yes Chewing/swallowing difficulties: no Changes in vision: no Changes to mood/headaches: no Hair loss/Cahnges to skin/Changes to nails: no Any difficulty focusing or concentrating: no Sweating: no Dizziness/Lightheaded: no Palpitations: no   Carbonated beverages: no N/V/D/C/GAS: no Abdominal Pain: no Dumping syndrome: no  Recent physical activity:  Gym 3 days a week, and 1 weekend day  Progress Towards Goal(s):  In Progress   Teaching Method Utilized:  Visual Auditory Hands on  Demonstrated degree of understanding via:  Teach Back   Monitoring/Evaluation:  Dietary intake, exercise, and body weight.

## 2019-10-03 DIAGNOSIS — Z9884 Bariatric surgery status: Secondary | ICD-10-CM | POA: Diagnosis not present

## 2019-10-03 DIAGNOSIS — E669 Obesity, unspecified: Secondary | ICD-10-CM | POA: Diagnosis not present

## 2019-10-03 DIAGNOSIS — E7800 Pure hypercholesterolemia, unspecified: Secondary | ICD-10-CM | POA: Diagnosis not present

## 2019-10-08 MED FILL — DULoxetine HCL 60 MG CPEP: 60 | 90 days supply | Qty: 90 | Fill #3

## 2019-10-08 MED FILL — HYDROXYCHLOROQUINE SULFATE: 200 | 90 days supply | Qty: 180 | Fill #1

## 2019-10-22 DIAGNOSIS — E7800 Pure hypercholesterolemia, unspecified: Secondary | ICD-10-CM | POA: Diagnosis not present

## 2019-10-22 DIAGNOSIS — E669 Obesity, unspecified: Secondary | ICD-10-CM | POA: Diagnosis not present

## 2019-10-24 ENCOUNTER — Other Ambulatory Visit: Payer: Self-pay

## 2019-10-24 ENCOUNTER — Encounter: Payer: 59 | Attending: General Surgery | Admitting: Skilled Nursing Facility1

## 2019-10-24 DIAGNOSIS — E669 Obesity, unspecified: Secondary | ICD-10-CM | POA: Diagnosis not present

## 2019-10-24 NOTE — Progress Notes (Signed)
Follow-up visit:  Post-Operative sleeve Surgery   Surgery date: 10/02/2018 Surgery type: Sleeve Gastrectomy Start weight at [facility]: 301.9 lbs (date: 06/05/2018)  Body Composition Scale 03/12/2019 06/25/2019 07/24/2019 10/24/2019  Weight  lbs 237.8 226.8 224.1 222.5  Total Body Fat  % 42.5 41.3 40.9 40.6     Visceral Fat 13 12 12 11   Fat-Free Mass  % 57.4 58.6 59 59.3     Total Body Water  % 43.2 43.8 44 44.1     Muscle-Mass  lbs 33.5 33.3 33.3 33.4  BMI 38.3 36.5 36.1 35.8  Body Fat Displacement ---           Torso  lbs 62.6 58 56.8 56        Left Leg  lbs 12.5 11.6 11.3 11.2        Right Leg  lbs 12.5 11.6 11.3 11.2        Left Arm  lbs 6.2 5.8 5.6 5.6        Right Arm  lbs 6.2 5.8 5.6 5.6    Pt states she was still having some intermittent diarrhea so she started eating more apples which has helped to stop the diarrhea. Pt states she has trouble remembering to take her multivitamin. Pt states she notices if she does not take her multi she feels very tired. Pt states she will start a new exercise program and will do a mud run this Saturday. Pt states she listens to her body when it is full and stops eating.   24 hr recall: every other week eating out  First meal: egg whites + shredded cheese Snack: protein shake (if not going get a lunch any time soon) Snack: 1 hard boiled egg + fruit Lunch: chicken breast with broccoli and cauliflower or fish + green beans Snack: apple + 1T peanut butter Dinner: seafood or chicken + green beans and carrots Snack: sugar free jello or pudding or skinny pop popcorn  Beverage: decaff coffee + sugar free creamer, water  (70 ounces)  Goals: Get back into consistently exercising   Fluid intake: 64oz   Medications: See List Supplementation: multi and calcium  Using straws: no Drinking while eating: no Having you been chewing well: yes Chewing/swallowing difficulties: no Changes in vision: no Changes to mood/headaches: no Hair loss/Cahnges to  skin/Changes to nails: no Any difficulty focusing or concentrating: no Sweating: no Dizziness/Lightheaded: no Palpitations: no  Carbonated beverages: no N/V/D/C/GAS: no Abdominal Pain: no Dumping syndrome: no  Recent physical activity:  ADL's wanting to do 3 times a week 45 minutes    Progress Towards Goal(s):  In Progress   Teaching Method Utilized:  Visual Auditory Hands on  Demonstrated degree of understanding via:  Teach Back   Monitoring/Evaluation:  Dietary intake, exercise, and body weight.

## 2019-11-20 DIAGNOSIS — Z79899 Other long term (current) drug therapy: Secondary | ICD-10-CM | POA: Diagnosis not present

## 2019-11-20 DIAGNOSIS — H3554 Dystrophies primarily involving the retinal pigment epithelium: Secondary | ICD-10-CM | POA: Diagnosis not present

## 2019-11-20 DIAGNOSIS — M329 Systemic lupus erythematosus, unspecified: Secondary | ICD-10-CM | POA: Diagnosis not present

## 2020-02-05 ENCOUNTER — Other Ambulatory Visit: Payer: Self-pay | Admitting: Internal Medicine

## 2020-02-05 ENCOUNTER — Other Ambulatory Visit (HOSPITAL_COMMUNITY): Payer: Self-pay | Admitting: Physician Assistant

## 2020-02-05 MED FILL — HYDROXYCHLOROQUINE SULFATE: 200 | 90 days supply | Qty: 180 | Fill #0

## 2020-02-06 ENCOUNTER — Other Ambulatory Visit: Payer: Self-pay | Admitting: Internal Medicine

## 2020-02-07 ENCOUNTER — Other Ambulatory Visit: Payer: Self-pay | Admitting: Internal Medicine

## 2020-02-10 ENCOUNTER — Other Ambulatory Visit: Payer: Self-pay | Admitting: Internal Medicine

## 2020-02-10 NOTE — Telephone Encounter (Signed)
Pharmacy calling stating they never received it on the 5th like it says it was confirmed, would like for [place] to send it again.

## 2020-02-11 ENCOUNTER — Other Ambulatory Visit: Payer: Self-pay | Admitting: Internal Medicine

## 2020-02-11 MED FILL — DULoxetine HCL 60 MG CPEP: 60 | 90 days supply | Qty: 90 | Fill #0

## 2020-02-12 ENCOUNTER — Other Ambulatory Visit: Payer: Self-pay | Admitting: Internal Medicine

## 2020-04-23 ENCOUNTER — Ambulatory Visit: Payer: 59 | Admitting: Skilled Nursing Facility1

## 2020-04-29 ENCOUNTER — Encounter: Payer: BC Managed Care – PPO | Attending: General Surgery | Admitting: Skilled Nursing Facility1

## 2020-04-29 ENCOUNTER — Other Ambulatory Visit: Payer: Self-pay

## 2020-04-29 NOTE — Progress Notes (Signed)
Follow-up visit:  Post-Operative sleeve Surgery   Surgery date: 10/02/2018 Surgery type: Sleeve Gastrectomy Start weight at [facility]: 301.9 lbs (date: 06/05/2018) Current weight: 226.1 (04/30/2019)  Body Composition Scale 03/12/2019 06/25/2019 07/24/2019 10/24/2019 04/29/2020  Weight  lbs 237.8 226.8 224.1 222.5 226.1  Total Body Fat  % 42.5 41.3 40.9 40.6 41.2     Visceral Fat 13 12 12 11 12   Fat-Free Mass  % 57.4 58.6 59 59.3 58.7     Total Body Water  % 43.2 43.8 44 44.1 43.8     Muscle-Mass  lbs 33.5 33.3 33.3 33.4 33.3  BMI 38.3 36.5 36.1 35.8 36.4  Body Fat Displacement ---            Torso  lbs 62.6 58 56.8 56 57.7        Left Leg  lbs 12.5 11.6 11.3 11.2 11.5        Right Leg  lbs 12.5 11.6 11.3 11.2 11.5        Left Arm  lbs 6.2 5.8 5.6 5.6 5.7        Right Arm  lbs 6.2 5.8 5.6 5.6 5.7    Pt states that she has gotten a new job which is less stressful and she has been able to have more time for herself which has helped her make more thoughtful food choices throughout the day. Pt states she has not been weighing herself daily and only weighs herself once a month. Pt states she is no longer doing [employer] to be able to work on . Pt states she has been incorporating a walk on her lunch break. Pt states she is a routine eater. Pt states she tries not to snack at night because this is when she grazes. Pt states she has finally found a bariatric MVI that does not make her nauseous. Pt states that since keeping water and having more veggies has helped with bowel movement regularly. Pt states she does not feel hungry and has to think through mindful eating. Pt states she is able to feel when she's full. Pt states her biggest goal has been to not obsess about the scale and it's number. Pt states she still wants to lose more weight but wants to be comfortable and healthy. Pt states she wants to lose 30 more pounds, but wants to focus on other things to reach this goal. Pt states she's not  afraid to try new foods.   24 hr recall:  First meal: egg whites with chopped peppers Snack: cheese, apples, grapes Lunch: grilled chicken patty + cooked veggie  Snack: yogurt  Dinner: protein + starch + vegetables Snack: popcorn occasionally Beverage: decaf/half-caff coffee + sugar free creamer, water  Goals: Get back into consistently exercising and increasing exercise time or intensity Stop focusing on weight and rather health and happiness  Fluid intake: 80 oz   Medications: See List Supplementation: fusion multi and calcium  Using straws: no Drinking while eating: no Having you been chewing well: yes Chewing/swallowing difficulties: no Changes in vision: no Changes to mood/headaches: no Hair loss/Cahnges to skin/Changes to nails: no Any difficulty focusing or concentrating: no Sweating: no Dizziness/Lightheaded: no Palpitations: no  Carbonated beverages: no N/V/D/C/GAS: no Abdominal Pain: no Dumping syndrome: no  Recent physical activity:  30 min walk, 4-5x/week  Progress Towards Goal(s):  In Progress   Teaching Method Utilized:  Visual Auditory Hands on  Demonstrated degree of understanding via:  Teach Back   Monitoring/Evaluation:  Dietary intake, exercise,  and body weight.   Pt will return to [facility] for follow-up in 6 months.

## 2020-04-30 ENCOUNTER — Encounter: Payer: Self-pay | Admitting: Internal Medicine

## 2020-04-30 ENCOUNTER — Ambulatory Visit (INDEPENDENT_AMBULATORY_CARE_PROVIDER_SITE_OTHER): Payer: BC Managed Care – PPO | Admitting: Internal Medicine

## 2020-04-30 ENCOUNTER — Other Ambulatory Visit: Payer: Self-pay | Admitting: Internal Medicine

## 2020-04-30 VITALS — BP 120/80 | HR 77 | Temp 98.3°F | Resp 18 | Ht 66.0 in | Wt 227.8 lb

## 2020-04-30 DIAGNOSIS — F32A Depression, unspecified: Secondary | ICD-10-CM

## 2020-04-30 DIAGNOSIS — K649 Unspecified hemorrhoids: Secondary | ICD-10-CM

## 2020-04-30 DIAGNOSIS — E669 Obesity, unspecified: Secondary | ICD-10-CM

## 2020-04-30 DIAGNOSIS — Z9884 Bariatric surgery status: Secondary | ICD-10-CM | POA: Diagnosis not present

## 2020-04-30 DIAGNOSIS — M25552 Pain in left hip: Secondary | ICD-10-CM | POA: Insufficient documentation

## 2020-04-30 DIAGNOSIS — Z1159 Encounter for screening for other viral diseases: Secondary | ICD-10-CM

## 2020-04-30 DIAGNOSIS — F419 Anxiety disorder, unspecified: Secondary | ICD-10-CM

## 2020-04-30 DIAGNOSIS — Z0000 Encounter for general adult medical examination without abnormal findings: Secondary | ICD-10-CM | POA: Diagnosis not present

## 2020-04-30 LAB — LIPID PANEL
Cholesterol: 208 mg/dL — ABNORMAL HIGH (ref 0–200)
HDL: 59.5 mg/dL (ref >39.00)
LDL Cholesterol: 129 mg/dL — ABNORMAL HIGH (ref 0–99)
NonHDL: 148.66
Total CHOL/HDL Ratio: 3
Triglycerides: 99 mg/dL (ref 0.0–149.0)
VLDL: 19.8 mg/dL (ref 0.0–40.0)

## 2020-04-30 LAB — COMPREHENSIVE METABOLIC PANEL
ALT: 16 U/L (ref 0–35)
AST: 16 U/L (ref 0–37)
Albumin: 4.1 g/dL (ref 3.5–5.2)
Alkaline Phosphatase: 36 U/L — ABNORMAL LOW (ref 39–117)
BUN: 11 mg/dL (ref 6–23)
CO2: 30 mEq/L (ref 19–32)
Calcium: 9.2 mg/dL (ref 8.4–10.5)
Chloride: 106 mEq/L (ref 96–112)
Creatinine, Ser: 0.76 mg/dL (ref 0.40–1.20)
GFR: 96.6 mL/min (ref >60.00)
Glucose, Bld: 90 mg/dL (ref 70–99)
Potassium: 4.7 mEq/L (ref 3.5–5.1)
Sodium: 140 mEq/L (ref 135–145)
Total Bilirubin: 0.7 mg/dL (ref 0.2–1.2)
Total Protein: 6.5 g/dL (ref 6.0–8.3)

## 2020-04-30 LAB — CBC
HCT: 41.9 % (ref 36.0–46.0)
Hemoglobin: 13.8 g/dL (ref 12.0–15.0)
MCHC: 33 g/dL (ref 30.0–36.0)
MCV: 93.3 fl (ref 78.0–100.0)
Platelets: 175 10*3/uL (ref 150.0–400.0)
RBC: 4.49 Mil/uL (ref 3.87–5.11)
RDW: 13.3 % (ref 11.5–15.5)
WBC: 4.7 10*3/uL (ref 4.0–10.5)

## 2020-04-30 LAB — VITAMIN B12: Vitamin B-12: 702 pg/mL (ref 211–911)

## 2020-04-30 LAB — VITAMIN D 25 HYDROXY (VIT D DEFICIENCY, FRACTURES): VITD: 27.18 ng/mL — ABNORMAL LOW (ref 30.00–100.00)

## 2020-04-30 MED ORDER — DULOXETINE HCL 60 MG PO CPEP: 60.0000 mg | ORAL_CAPSULE | Freq: Every day | ORAL | 3 refills | Status: DC

## 2020-04-30 MED ORDER — HYDROCORTISONE (PERIANAL) 2.5 % EX CREA: 1.0000 "application " | TOPICAL_CREAM | Freq: Two times a day (BID) | CUTANEOUS | 6 refills | Status: DC

## 2020-04-30 NOTE — Assessment & Plan Note (Signed)
Flu shot up to date. Covid-19 up to date including booster. Tetanus up to date. Mammogram ordered, pap smear due. Counseled about sun safety and mole surveillance. Counseled about the dangers of distracted driving. Given 10 year screening recommendations.

## 2020-04-30 NOTE — Progress Notes (Signed)
   Subjective:   Patient ID: Emily Li, female    DOB: [birth year], 45 y.o.   MRN: [id number]  HPI The patient is a 45 YO female coming in for physical.   PMH, [facility], social history reviewed and updated.   Review of Systems  Constitutional: Negative.   HENT: Negative.   Eyes: Negative.   Respiratory: Negative for cough, chest tightness and shortness of breath.   Cardiovascular: Negative for chest pain, palpitations and leg swelling.  Gastrointestinal: Negative for abdominal distention, abdominal pain, constipation, diarrhea, nausea and vomiting.  Musculoskeletal: Negative.   Skin: Negative.   Neurological: Negative.   Psychiatric/Behavioral: Negative.     Objective:  Physical Exam Constitutional:      Appearance: She is well-developed.  HENT:     Head: Normocephalic and atraumatic.  Cardiovascular:     Rate and Rhythm: Normal rate and regular rhythm.  Pulmonary:     Effort: Pulmonary effort is normal. No respiratory distress.     Breath sounds: Normal breath sounds. No wheezing or rales.  Abdominal:     General: Bowel sounds are normal. There is no distension.     Palpations: Abdomen is soft.     Tenderness: There is no abdominal tenderness. There is no rebound.  Musculoskeletal:     Cervical back: Normal range of motion.  Skin:    General: Skin is warm and dry.  Neurological:     Mental Status: She is alert and oriented to person, place, and time.     Coordination: Coordination normal.     Vitals:   04/30/20 1112  BP: 120/80  Pulse: 77  Resp: 18  Temp: 98.3 F (36.8 C)  TempSrc: Oral  SpO2: 97%  Weight: 227 lb 12.8 oz (103.3 kg)  Height: 5\' 6"  (1.676 m)    This visit occurred during the SARS-CoV-2 public health emergency.  Safety protocols were in place, including screening questions prior to the visit, additional usage of staff PPE, and extensive cleaning of exam room while observing appropriate contact time as indicated for disinfecting solutions.    Assessment & Plan:

## 2020-04-30 NOTE — Patient Instructions (Addendum)
Health Maintenance, Female Adopting a healthy lifestyle and getting preventive care are important in promoting health and wellness. Ask your health care provider about:  The right schedule for you to have regular tests and exams.  Things you can do on your own to prevent diseases and keep yourself healthy. What should I know about diet, weight, and exercise? Eat a healthy diet  Eat a diet that includes plenty of vegetables, fruits, low-fat dairy products, and lean protein.  Do not eat a lot of foods that are high in solid fats, added sugars, or sodium.   Maintain a healthy weight Body mass index (BMI) is used to identify weight problems. It estimates body fat based on height and weight. Your health care provider can help determine your BMI and help you achieve or maintain a healthy weight. Get regular exercise Get regular exercise. This is one of the most important things you can do for your health. Most adults should:  Exercise for at least 150 minutes each week. The exercise should increase your heart rate and make you sweat (moderate-intensity exercise).  Do strengthening exercises at least twice a week. This is in addition to the moderate-intensity exercise.  Spend less time sitting. Even light physical activity can be beneficial. Watch cholesterol and blood lipids Have your blood tested for lipids and cholesterol at 43 years of age, then have this test every 5 years. Have your cholesterol levels checked more often if:  Your lipid or cholesterol levels are high.  You are older than 43 years of age.  You are at high risk for heart disease. What should I know about cancer screening? Depending on your health history and family history, you may need to have cancer screening at various ages. This may include screening for:  Breast cancer.  Cervical cancer.  Colorectal cancer.  Skin cancer.  Lung cancer. What should I know about heart disease, diabetes, and high blood  pressure? Blood pressure and heart disease  High blood pressure causes heart disease and increases the risk of stroke. This is more likely to develop in people who have high blood pressure readings, are of African descent, or are overweight.  Have your blood pressure checked: ? Every 3-5 years if you are 18-39 years of age. ? Every year if you are 40 years old or older. Diabetes Have regular diabetes screenings. This checks your fasting blood sugar level. Have the screening done:  Once every three years after age 40 if you are at a normal weight and have a low risk for diabetes.  More often and at a younger age if you are overweight or have a high risk for diabetes. What should I know about preventing infection? Hepatitis B If you have a higher risk for hepatitis B, you should be screened for this virus. Talk with your health care provider to find out if you are at risk for hepatitis B infection. Hepatitis C Testing is recommended for:  Everyone born from 1945 through 1965.  Anyone with known risk factors for hepatitis C. Sexually transmitted infections (STIs)  Get screened for STIs, including gonorrhea and chlamydia, if: ? You are sexually active and are younger than 43 years of age. ? You are older than 43 years of age and your health care provider tells you that you are at risk for this type of infection. ? Your sexual activity has changed since you were last screened, and you are at increased risk for chlamydia or gonorrhea. Ask your health care provider   if you are at risk.  Ask your health care provider about whether you are at high risk for HIV. Your health care provider may recommend a prescription medicine to help prevent HIV infection. If you choose to take medicine to prevent HIV, you should first get tested for HIV. You should then be tested every 3 months for as long as you are taking the medicine. Pregnancy  If you are about to stop having your period (premenopausal) and  you may become pregnant, seek counseling before you get pregnant.  Take 400 to 800 micrograms (mcg) of folic acid every day if you become pregnant.  Ask for birth control (contraception) if you want to prevent pregnancy. Osteoporosis and menopause Osteoporosis is a disease in which the bones lose minerals and strength with aging. This can result in bone fractures. If you are 65 years old or older, or if you are at risk for osteoporosis and fractures, ask your health care provider if you should:  Be screened for bone loss.  Take a calcium or vitamin D supplement to lower your risk of fractures.  Be given hormone replacement therapy (HRT) to treat symptoms of menopause. Follow these instructions at home: Lifestyle  Do not use any products that contain nicotine or tobacco, such as cigarettes, e-cigarettes, and chewing tobacco. If you need help quitting, ask your health care provider.  Do not use street drugs.  Do not share needles.  Ask your health care provider for help if you need support or information about quitting drugs. Alcohol use  Do not drink alcohol if: ? Your health care provider tells you not to drink. ? You are pregnant, may be pregnant, or are planning to become pregnant.  If you drink alcohol: ? Limit how much you use to 0-1 drink a day. ? Limit intake if you are breastfeeding.  Be aware of how much alcohol is in your drink. In the [place] one drink equals one 12 oz bottle of beer (355 mL), one 5 oz glass of wine (148 mL), or one 1 oz glass of hard liquor (44 mL). General instructions  Schedule regular health, dental, and eye exams.  Stay current with your vaccines.  Tell your health care provider if: ? You often feel depressed. ? You have ever been abused or do not feel safe at home. Summary  Adopting a healthy lifestyle and getting preventive care are important in promoting health and wellness.  Follow your health care provider's instructions about healthy  diet, exercising, and getting tested or screened for diseases.  Follow your health care provider's instructions on monitoring your cholesterol and blood pressure. This information is not intended to replace advice given to you by your health care provider. Make sure you discuss any questions you have with your health care provider. Document Revised: 01/10/2018 Document Reviewed: 01/10/2018 Elsevier Patient Education  2021 Elsevier Inc.  

## 2020-04-30 NOTE — Assessment & Plan Note (Signed)
Refill anusol which she uses prn.

## 2020-04-30 NOTE — Assessment & Plan Note (Signed)
Referral to sports medicine.

## 2020-04-30 NOTE — Assessment & Plan Note (Signed)
Refill cymbalta 60 mg daily.

## 2020-04-30 NOTE — Assessment & Plan Note (Signed)
Has lost about 40 more pounds since gastric sleeve.

## 2020-04-30 NOTE — Assessment & Plan Note (Signed)
Checking B12 and vitamin D and CBC.

## 2020-05-01 LAB — HEPATITIS C ANTIBODY
Hepatitis C Ab: NONREACTIVE
SIGNAL TO CUT-OFF: 0.01 (ref <1.00)

## 2020-05-01 LAB — HIV ANTIBODY (ROUTINE TESTING W REFLEX): HIV 1&2 Ab, 4th Generation: NONREACTIVE

## 2020-05-12 ENCOUNTER — Encounter: Payer: Self-pay | Admitting: Family Medicine

## 2020-06-18 ENCOUNTER — Other Ambulatory Visit (HOSPITAL_COMMUNITY): Payer: Self-pay

## 2020-06-18 MED FILL — Hydroxychloroquine Sulfate Tab 200 MG: ORAL | 90 days supply | Qty: 180 | Fill #0 | Status: AC

## 2020-06-18 MED FILL — Duloxetine HCl Enteric Coated Pellets Cap 60 MG (Base Eq): ORAL | 90 days supply | Qty: 90 | Fill #0 | Status: AC

## 2020-08-04 ENCOUNTER — Other Ambulatory Visit (HOSPITAL_COMMUNITY): Payer: Self-pay

## 2020-10-21 ENCOUNTER — Other Ambulatory Visit: Payer: Self-pay

## 2020-10-21 MED ORDER — HYDROXYCHLOROQUINE SULFATE 200 MG PO TABS: ORAL_TABLET | Freq: Every day | ORAL | 1 refills | Status: DC

## 2020-10-28 ENCOUNTER — Other Ambulatory Visit: Payer: Self-pay

## 2020-10-28 ENCOUNTER — Encounter: Payer: BC Managed Care – PPO | Attending: General Surgery | Admitting: Skilled Nursing Facility1

## 2020-10-28 DIAGNOSIS — E669 Obesity, unspecified: Secondary | ICD-10-CM | POA: Insufficient documentation

## 2020-10-28 NOTE — Progress Notes (Signed)
Follow-up visit:  Post-Operative sleeve Surgery   Surgery date: 10/02/2018 Surgery type: Sleeve Gastrectomy Start weight at [facility]: 301.9 lbs (date: 06/05/2018) Current weight: 240 pounds  Body Composition Scale 06/25/2019 07/24/2019 10/24/2019 04/29/2020 10/28/2020  Weight  lbs 226.8 224.1 222.5 226.1 240  Total Body Fat  % 41.3 40.9 40.6 41.2 42.8     Visceral Fat 12 12 11 12 13   Fat-Free Mass  % 58.6 59 59.3 58.7 57.1     Total Body Water  % 43.8 44 44.1 43.8 43     Muscle-Mass  lbs 33.3 33.3 33.4 33.3 33.4  BMI 36.5 36.1 35.8 36.4 38.7  Body Fat Displacement             Torso  lbs 58 56.8 56 57.7 63.7        Left Leg  lbs 11.6 11.3 11.2 11.5 12.7        Right Leg  lbs 11.6 11.3 11.2 11.5 12.7        Left Arm  lbs 5.8 5.6 5.6 5.7 6.3        Right Arm  lbs 5.8 5.6 5.6 5.7 6.3    Pt states when she does not pay close enough attention she will have a very sedentary lifestyle not leaving her computer. Pt states she feels she makes healthy choices with her foods overall though stating she does not indulge in junk food. Pt states tracking foods does create stress but states maybe tracking activity will not cause her those same negative feelings.  Pt states logging causes stress but doe snot know how else to not gain weight.  Pt states she feel she eats till satisfaction, you ensure the appropriate portions on the plate, ensure complex car protein and non starchy vegetables are on the plate, also doe snot eat junk food also states she does not graze. Stating she will do a mental check and mindfully eat or not eat if she realizes she is not hungry. Pt states she doe snot snack when she is with her computer. Pt sates she does not like condiments so she does not add mayo or oils or butter but does add cheese stating she does love cheese. Pt does not drink caloric beverages and when ordering at [employer] gets low calorie beverages. Pt states 2 nights a week she will eat a cracker when she lets the dog  out.  Pt sates she loves going to the park with her dog.  Pt states she loves her new job and still has some settling in to do and is thinking about applying for a promotion but not sure yet.   24 hr recall:  First meal: egg whites with chopped peppers Snack: cheese, apples, grapes Lunch: grilled chicken patty + cooked veggie  Snack: yogurt  Dinner: protein + starch + vegetables Snack: popcorn occasionally Beverage: decaf/half-caff coffee + sugar free creamer, water, water + flavorings   Fluid intake: 80 oz   Medications: See List Supplementation: fusion multi and calcium  Using straws: no Drinking while eating: no Having you been chewing well: yes Chewing/swallowing difficulties: no Changes in vision: no Changes to mood/headaches: no Hair loss/Cahnges to skin/Changes to nails: no Any difficulty focusing or concentrating: no Sweating: no Dizziness/Lightheaded: no Palpitations: no  Carbonated beverages: no N/V/D/C/GAS: no Abdominal Pain: no Dumping syndrome: no  Recent physical activity:  ADL's  Progress Towards Goal(s):  In Progress  Goals: -be mindful with cheese  -go to the park on the weekend with your  dogs  -you are doing fantastic! don't let a little wight gain get you down you'll lose it !  Teaching Method Utilized:  Visual Auditory Hands on  Demonstrated degree of understanding via:  Teach Back   Monitoring/Evaluation:  Dietary intake, exercise, and body weight.   Pt will return to [facility] for follow-up

## 2020-11-24 ENCOUNTER — Ambulatory Visit: Payer: BC Managed Care – PPO | Attending: Internal Medicine

## 2020-11-24 DIAGNOSIS — Z23 Encounter for immunization: Secondary | ICD-10-CM

## 2020-11-24 NOTE — Progress Notes (Signed)
   Covid-19 Vaccination Clinic  Name:  [name]    MRN: [id number] DOB: 08/21/1975  11/24/2020  [name] was observed post Covid-19 immunization for 15 minutes without incident. She was provided with Vaccine Information Sheet and instruction to access the V-Safe system.   [name] was instructed to call 911 with any severe reactions post vaccine: Difficulty breathing  Swelling of face and throat  A fast heartbeat  A bad rash all over body  Dizziness and weakness   Immunizations Administered     Name Date Dose VIS Date Route   Pfizer Covid-19 Vaccine Bivalent Booster 11/24/2020  9:31 AM 0.3 mL 09/30/2020 Intramuscular   Manufacturer: [employer], [employer]   Lot: [id number]   [facility]: [phone]

## 2020-12-18 ENCOUNTER — Other Ambulatory Visit (HOSPITAL_BASED_OUTPATIENT_CLINIC_OR_DEPARTMENT_OTHER): Payer: Self-pay

## 2020-12-18 MED ORDER — PFIZER COVID-19 VAC BIVALENT 30 MCG/0.3ML IM SUSP
INTRAMUSCULAR | 0 refills | Status: DC
  Filled 2020-12-18: qty 0.3, 1d supply, fill #0

## 2021-01-27 ENCOUNTER — Ambulatory Visit: Payer: BC Managed Care – PPO | Admitting: Skilled Nursing Facility1

## 2021-03-24 ENCOUNTER — Other Ambulatory Visit: Payer: Self-pay | Admitting: Internal Medicine

## 2021-05-08 IMAGING — CR CHEST - 2 VIEW
2 series · 2 of 2 positions shown · non-contrast
Comparison: 02/12/2018

CLINICAL DATA: Morbid obesity. Pre-op evaluation for bariatric
surgery.

EXAM:
CHEST - 2 VIEW

[w chest pa *]
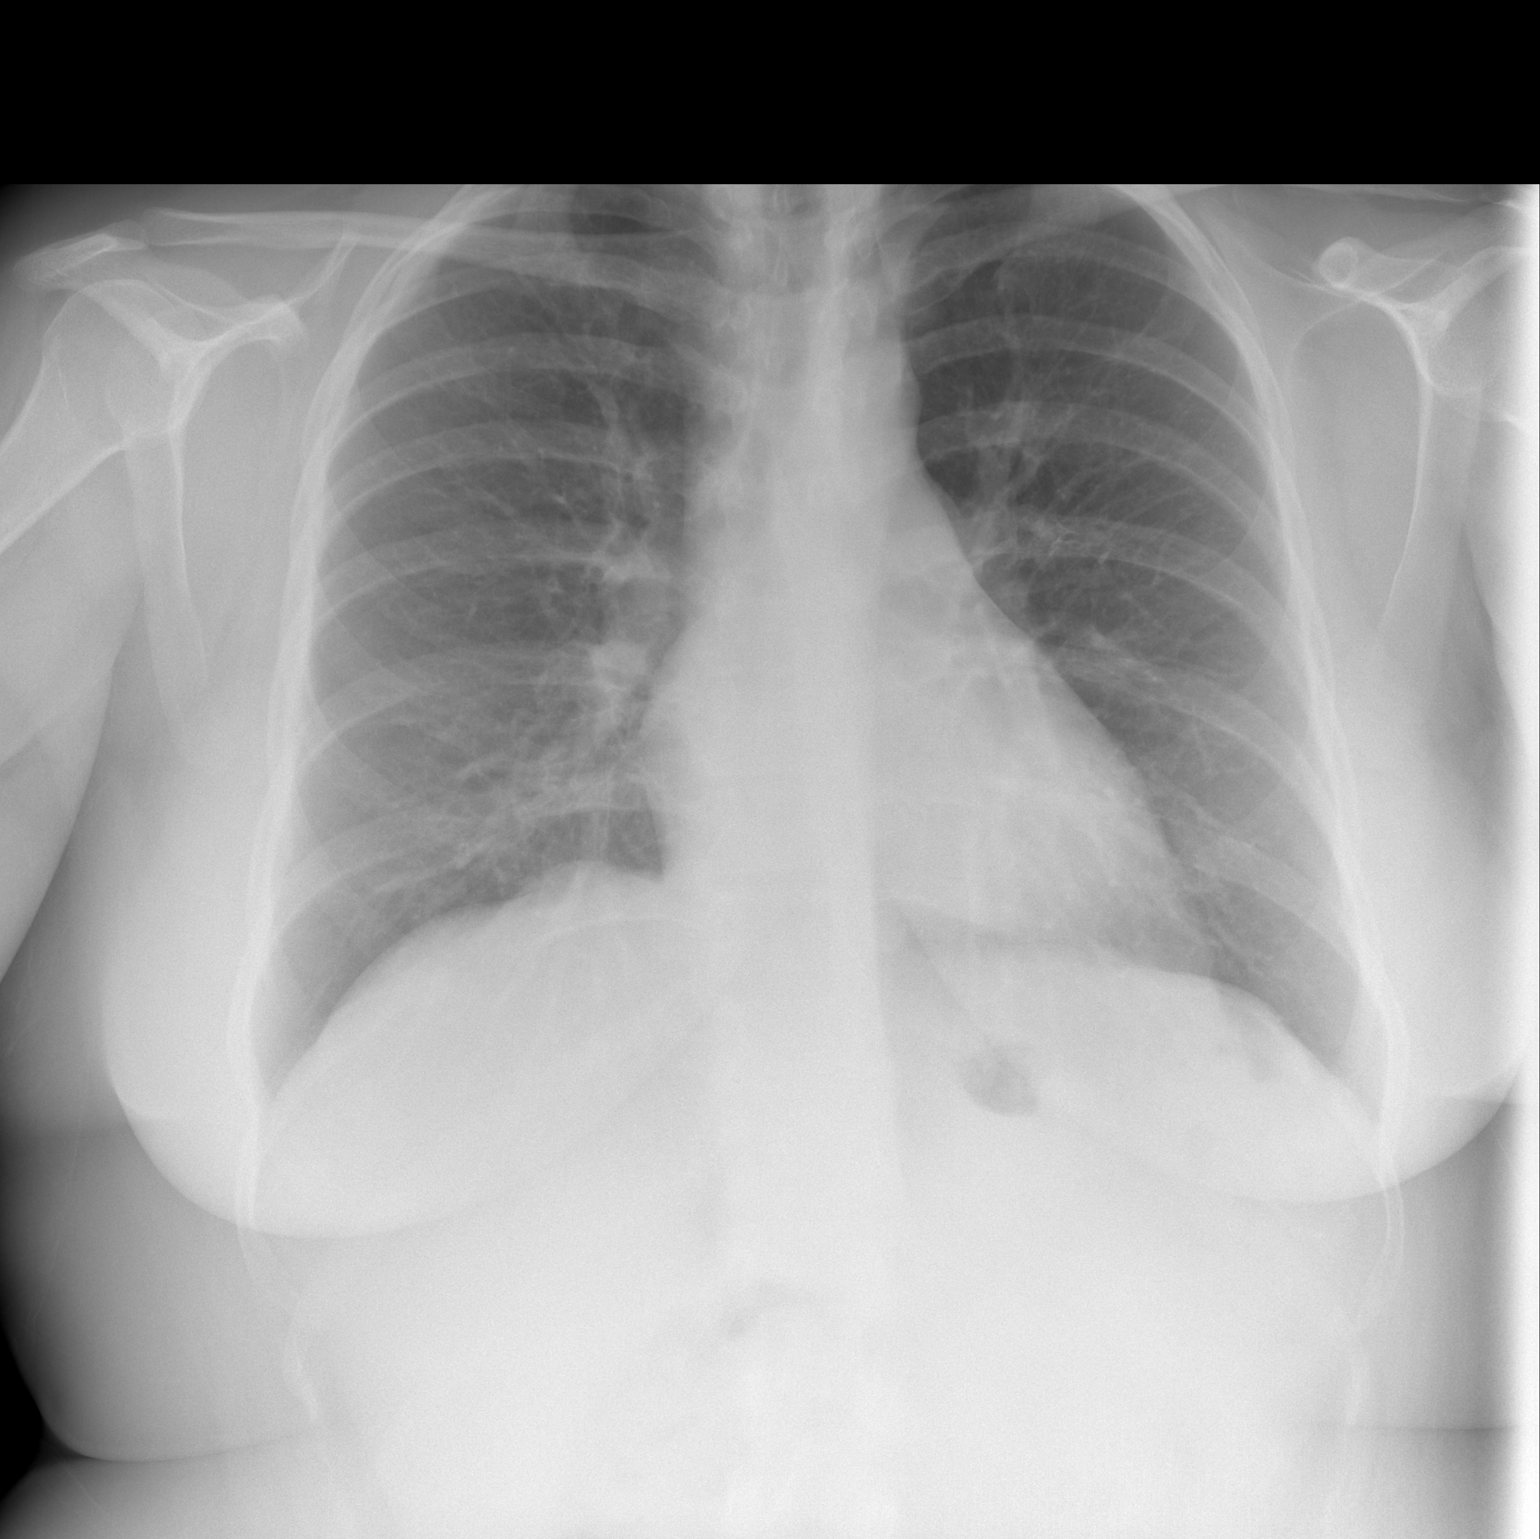

[w chest lat]
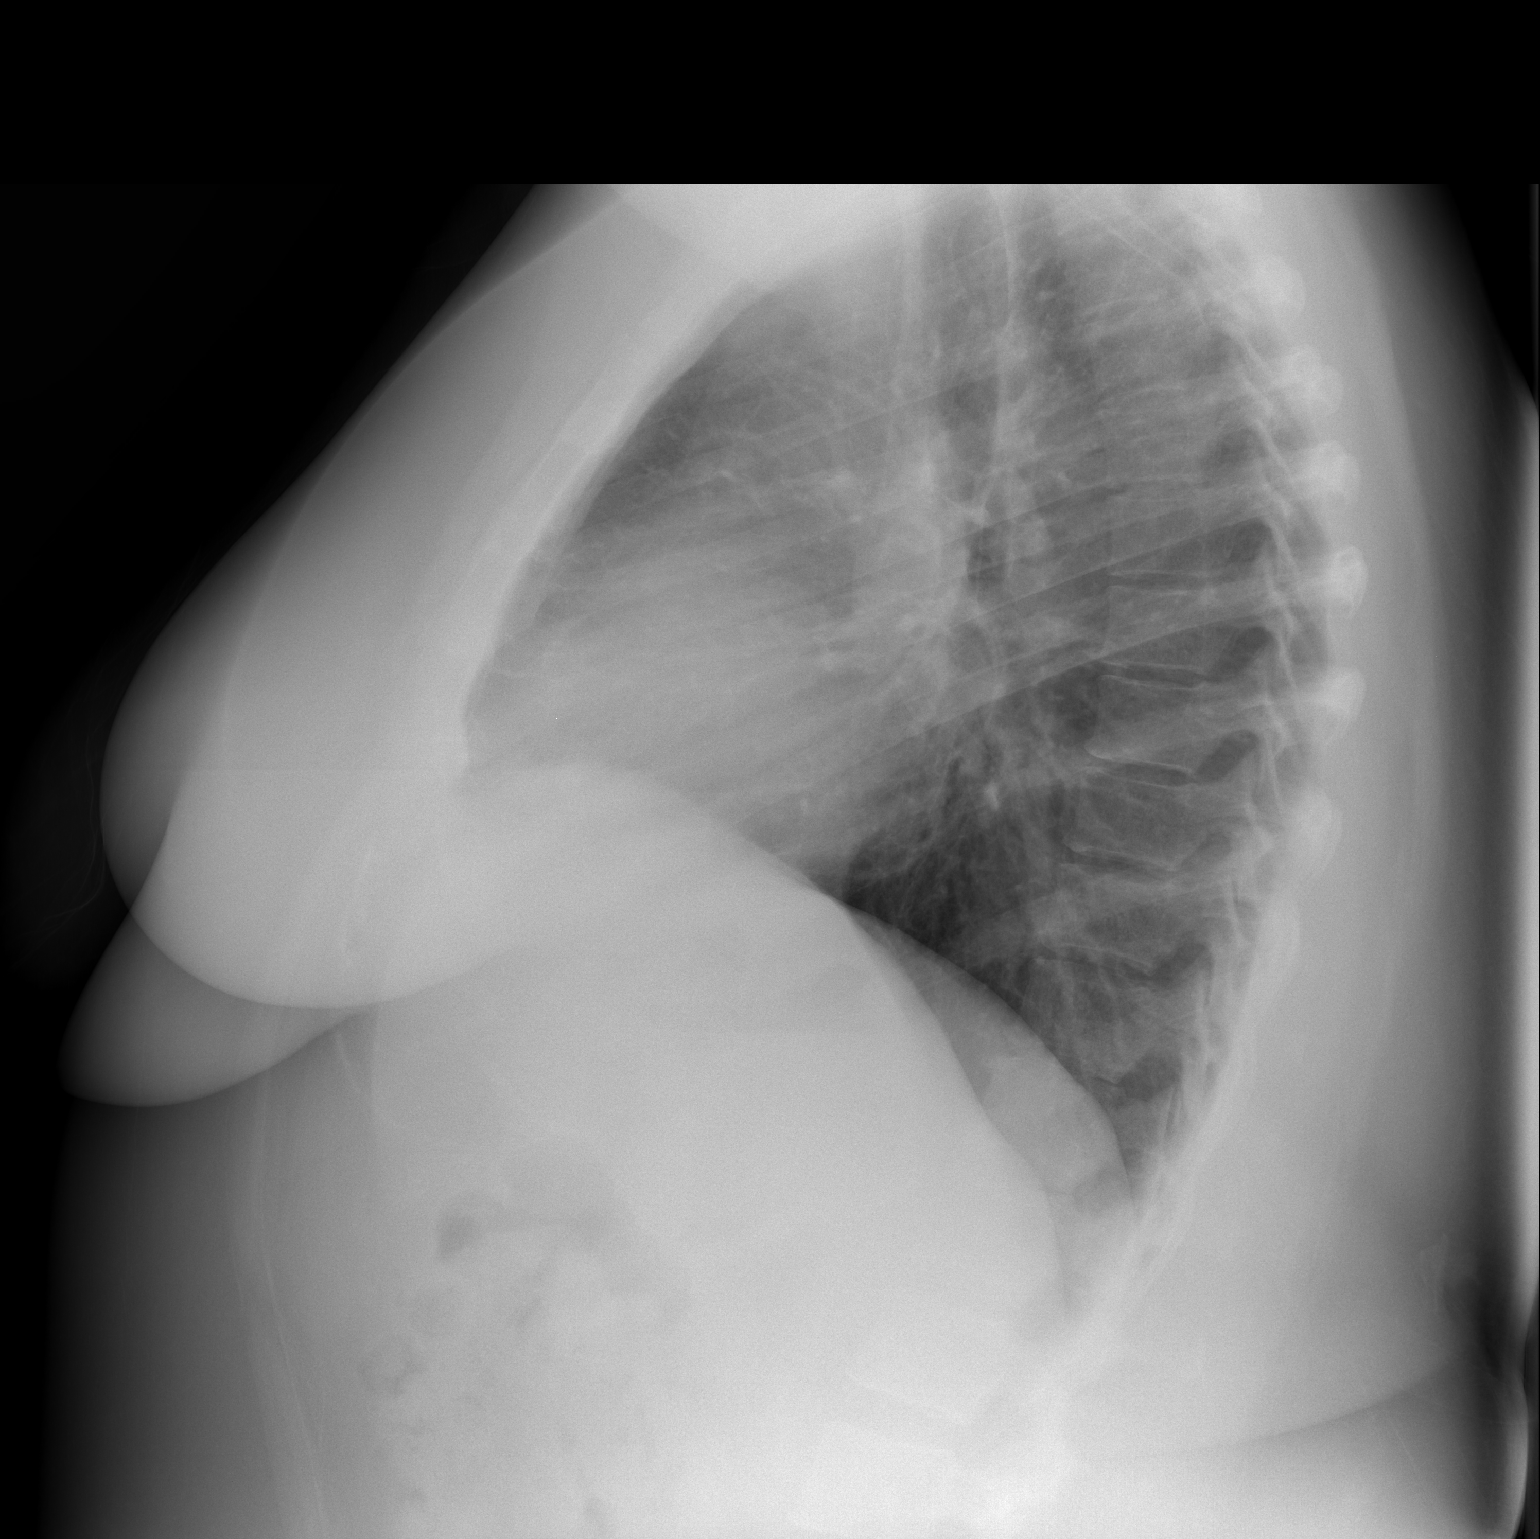

[2 of 2 positions shown; findings below may reference images not displayed]

FINDINGS: The heart size and mediastinal contours are within normal limits.
Both lungs are clear. Previously seen left lower lobe airspace
disease has resolved since previous study. No evidence of pleural
effusion. The visualized skeletal structures are unremarkable.
IMPRESSION: No active cardiopulmonary disease.

## 2021-05-08 IMAGING — RF UPPER GI SERIES (WITHOUT KUB)
13 series · 13 of 13 positions shown · non-contrast
Comparison: None.

CLINICAL DATA: Morbid obesity. Pre-op evaluation for bariatric
surgery.

EXAM:
UPPER GI SERIES WITH KUB
TECHNIQUE: After obtaining a scout radiograph a routine upper GI series was
performed using thin barium.
FLUOROSCOPY TIME:  Fluoroscopy Time:  1 minutes 48 seconds
Radiation Exposure Index (if provided by the fluoroscopic device):
43.3 mGy
Number of Acquired Spot Images: 0

[Series 1: t abdomen supine · 0.15mm/px · 1 of 1 slices shown]
[im 1/1]
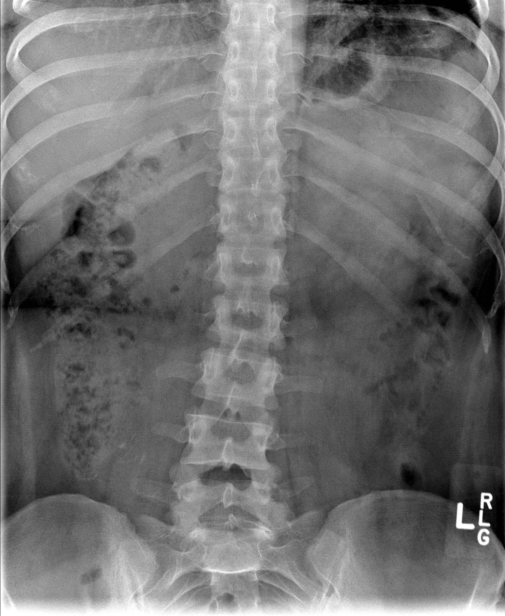

[Series 2: cp_standard · 0.26mm/px · 1 of 1 slices shown (1 of 12)]
[im 1/1]
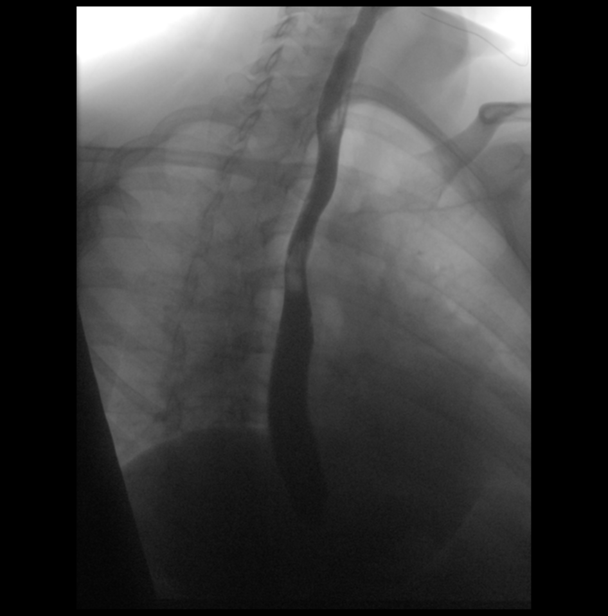

[Series 3: cp_standard · 0.26mm/px · 1 of 1 slices shown (2 of 12)]
[im 1/1]
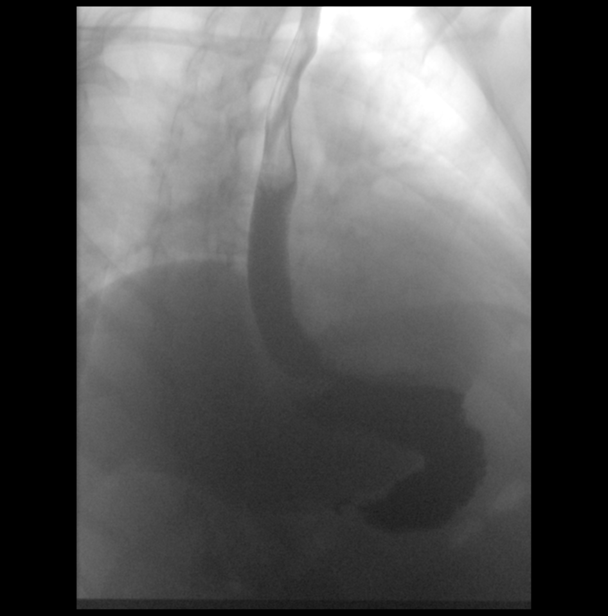

[Series 4: cp_standard · 0.26mm/px · 1 of 1 slices shown (3 of 12)]
[im 1/1]
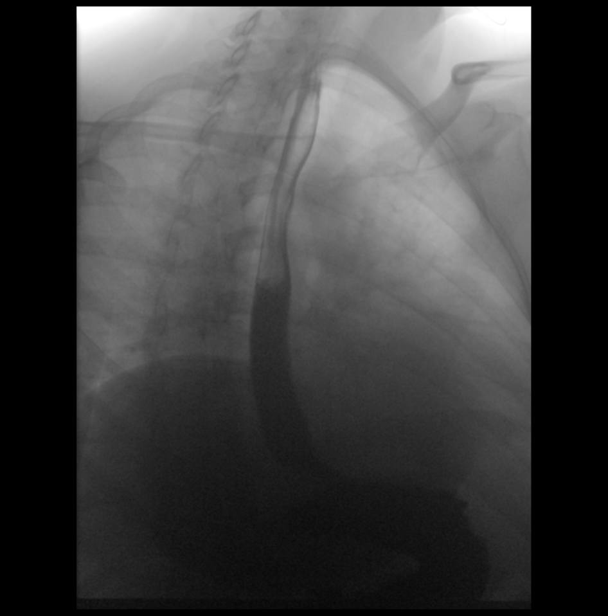

[Series 5: cp_standard · 0.29mm/px · 1 of 1 slices shown (4 of 12)]
[im 1/1]
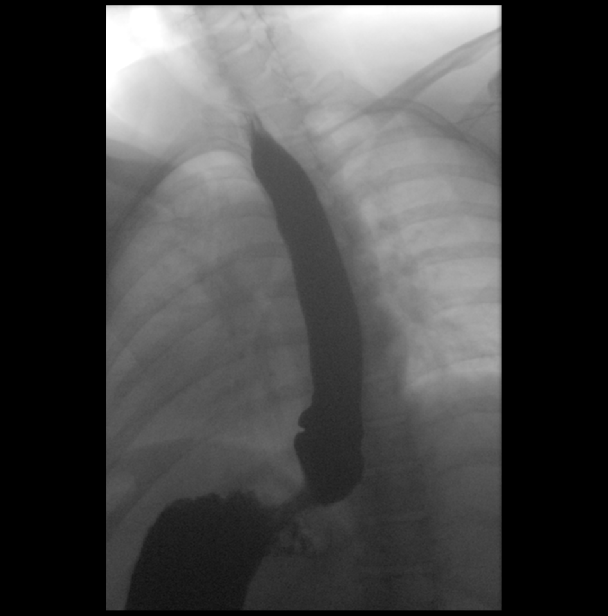

[Series 6: cp_standard · 0.30mm/px · 1 of 1 slices shown (5 of 12)]
[im 1/1]
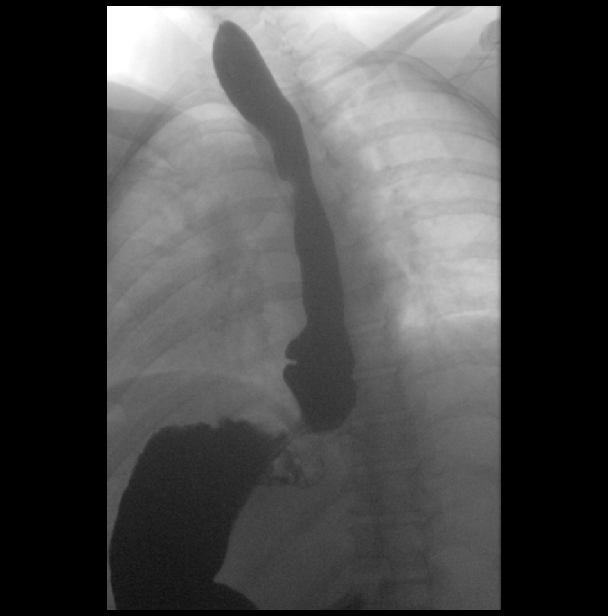

[Series 7: cp_standard · 0.30mm/px · 1 of 1 slices shown (6 of 12)]
[im 1/1]
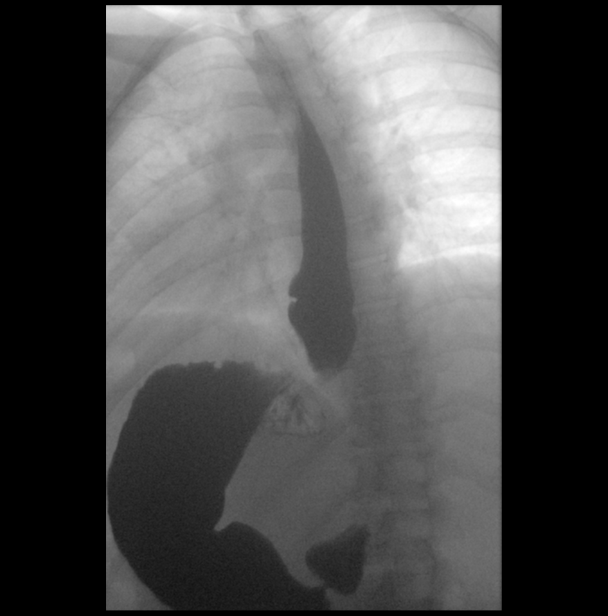

[Series 8: cp_standard · 0.20mm/px · 1 of 1 slices shown (7 of 12)]
[im 1/1]
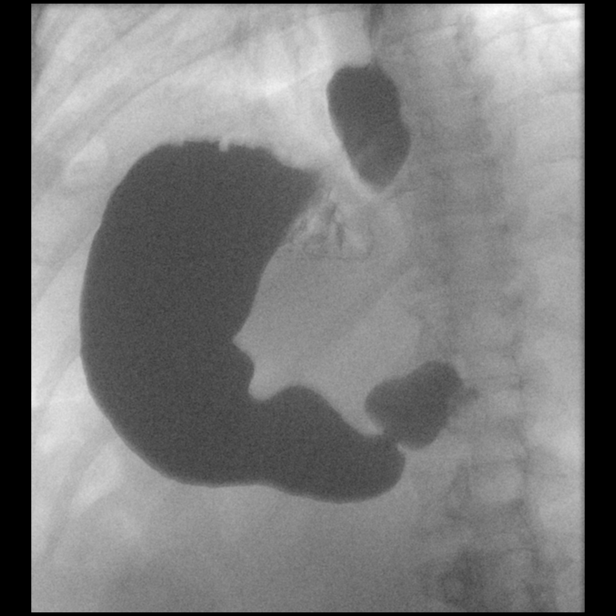

[Series 9: cp_standard · 0.19mm/px · 1 of 1 slices shown (8 of 12)]
[im 1/1]
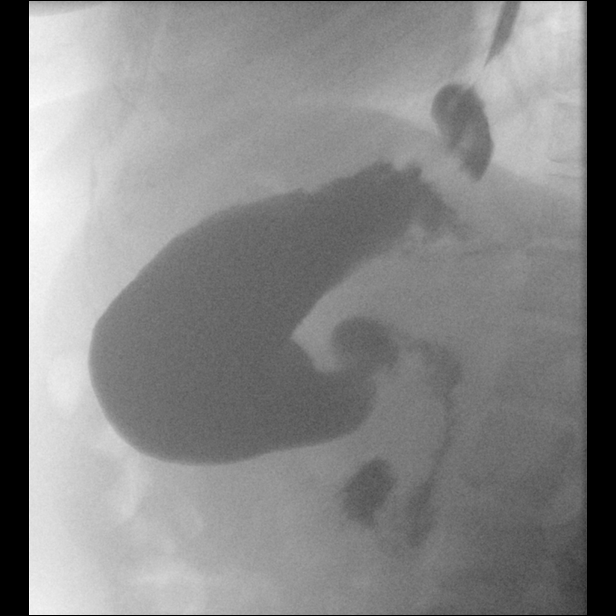

[Series 10: cp_standard · 0.19mm/px · 1 of 1 slices shown (9 of 12)]
[im 1/1]
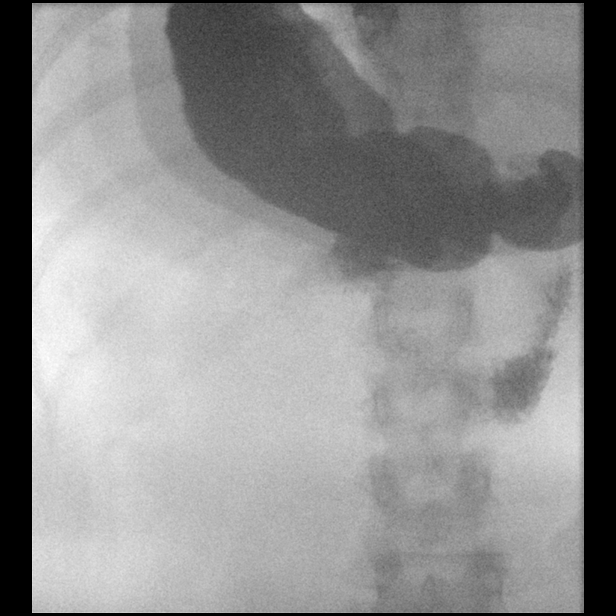

[Series 11: cp_standard · 0.20mm/px · 1 of 1 slices shown (10 of 12)]
[im 1/1]
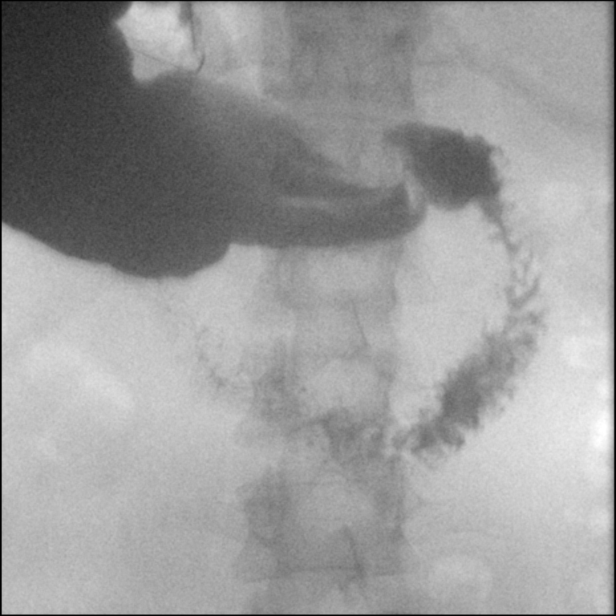

[Series 12: cp_standard · 0.20mm/px · 1 of 1 slices shown (11 of 12)]
[im 1/1]
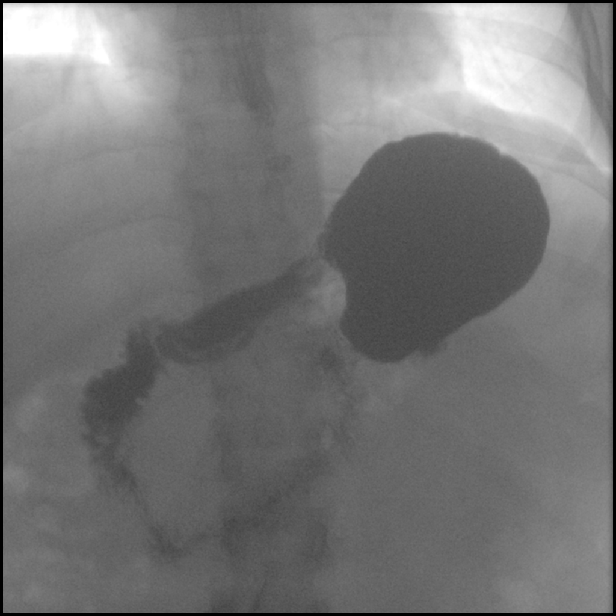

[Series 13: cp_standard · 0.19mm/px · 1 of 1 slices shown (12 of 12)]
[im 1/1]
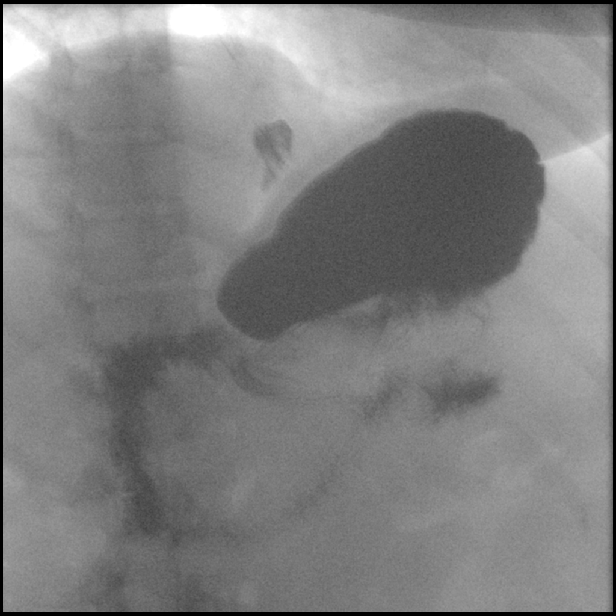

[13 of 13 positions shown; findings below may reference images not displayed]

FINDINGS: Scout radiograph:  Unremarkable bowel gas pattern.

Esophagus: No evidence of esophageal mass or stricture. Motility is
within normal limits. No gastroesophageal reflux observed.

Stomach: Tiny sliding hiatal hernia is seen. No evidence of gastric
mass or ulcer.

Duodenum: No ulcer or other significant abnormality seen involving
duodenal bulb or sweep.

Other:  None.
IMPRESSION: Tiny sliding hiatal hernia. Otherwise negative GI series.

## 2021-05-11 ENCOUNTER — Other Ambulatory Visit: Payer: Self-pay | Admitting: Internal Medicine

## 2021-05-25 ENCOUNTER — Ambulatory Visit (INDEPENDENT_AMBULATORY_CARE_PROVIDER_SITE_OTHER): Payer: BC Managed Care – PPO | Admitting: Internal Medicine

## 2021-05-25 ENCOUNTER — Encounter: Payer: Self-pay | Admitting: Internal Medicine

## 2021-05-25 VITALS — BP 122/80 | HR 77 | Resp 18 | Ht 66.0 in | Wt 245.8 lb

## 2021-05-25 DIAGNOSIS — E669 Obesity, unspecified: Secondary | ICD-10-CM

## 2021-05-25 DIAGNOSIS — F32A Depression, unspecified: Secondary | ICD-10-CM

## 2021-05-25 DIAGNOSIS — F419 Anxiety disorder, unspecified: Secondary | ICD-10-CM | POA: Diagnosis not present

## 2021-05-25 DIAGNOSIS — Z1283 Encounter for screening for malignant neoplasm of skin: Secondary | ICD-10-CM | POA: Diagnosis not present

## 2021-05-25 DIAGNOSIS — M329 Systemic lupus erythematosus, unspecified: Secondary | ICD-10-CM | POA: Diagnosis not present

## 2021-05-25 DIAGNOSIS — Z0000 Encounter for general adult medical examination without abnormal findings: Secondary | ICD-10-CM | POA: Diagnosis not present

## 2021-05-25 MED ORDER — DULOXETINE HCL 60 MG PO CPEP: ORAL_CAPSULE | Freq: Every day | ORAL | 3 refills | Status: DC

## 2021-05-25 MED ORDER — HYDROXYCHLOROQUINE SULFATE 200 MG PO TABS: 400.0000 mg | ORAL_TABLET | Freq: Every day | ORAL | 3 refills | Status: DC

## 2021-05-25 NOTE — Assessment & Plan Note (Signed)
Taking wegovy 1 mg weekly and just started due to some weight gain. Prior sleeve gastrectomy. If needed we can prescribe instead of her surgeon.  ?

## 2021-05-25 NOTE — Assessment & Plan Note (Signed)
Refill cymbalta 60 mg daily and rare alprazolam (not filled in some time). Continue at current dosing and controlled.  ?

## 2021-05-25 NOTE — Assessment & Plan Note (Signed)
Flu shot up to date. Covid-19 up to date. Tetanus up to date. Mammogram up to date, pap smear due at [facility]. Counseled about sun safety and mole surveillance. Counseled about the dangers of distracted driving. Given 10 year screening recommendations.  ? ?

## 2021-05-25 NOTE — Progress Notes (Signed)
? ?  Subjective:  ? ?Patient ID: Emily Li, female    DOB: 01-19-1976, 46 y.o.   MRN: [id number] ? ?HPI ?The patient is here for physical. ? ?PMH, [facility], social history reviewed and updated ? ?Review of Systems  ?Constitutional: Negative.   ?HENT: Negative.    ?Eyes: Negative.   ?Respiratory:  Negative for cough, chest tightness and shortness of breath.   ?Cardiovascular:  Negative for chest pain, palpitations and leg swelling.  ?Gastrointestinal:  Negative for abdominal distention, abdominal pain, constipation, diarrhea, nausea and vomiting.  ?Musculoskeletal: Negative.   ?Skin: Negative.   ?Neurological: Negative.   ?Psychiatric/Behavioral: Negative.    ? ?Objective:  ?Physical Exam ?Constitutional:   ?   Appearance: She is well-developed. She is obese.  ?HENT:  ?   Head: Normocephalic and atraumatic.  ?Cardiovascular:  ?   Rate and Rhythm: Normal rate and regular rhythm.  ?Pulmonary:  ?   Effort: Pulmonary effort is normal. No respiratory distress.  ?   Breath sounds: Normal breath sounds. No wheezing or rales.  ?Abdominal:  ?   General: Bowel sounds are normal. There is no distension.  ?   Palpations: Abdomen is soft.  ?   Tenderness: There is no abdominal tenderness. There is no rebound.  ?Musculoskeletal:  ?   Cervical back: Normal range of motion.  ?Skin: ?   General: Skin is warm and dry.  ?Neurological:  ?   Mental Status: She is alert and oriented to person, place, and time.  ?   Coordination: Coordination normal.  ? ? ?Vitals:  ? 05/25/21 1500  ?BP: 122/80  ?Pulse: 77  ?Resp: 18  ?SpO2: 97%  ?Weight: 245 lb 12.8 oz (111.5 kg)  ?Height: 5\' 6"  (1.676 m)  ? ? ?This visit occurred during the SARS-CoV-2 public health emergency.  Safety protocols were in place, including screening questions prior to the visit, additional usage of staff PPE, and extensive cleaning of exam room while observing appropriate contact time as indicated for disinfecting solutions.  ? ?Assessment & Plan:  ? ?

## 2021-05-25 NOTE — Assessment & Plan Note (Signed)
Symptoms are controlled. Eye exam up to date and yearly reminded. Refill plaquenil 400 mg daily. Reviewed recent CBC and CMP from [facility] and no indication for change.  ?

## 2021-06-08 ENCOUNTER — Encounter: Payer: Self-pay | Admitting: Internal Medicine

## 2021-06-09 ENCOUNTER — Other Ambulatory Visit (HOSPITAL_COMMUNITY): Payer: Self-pay

## 2021-06-09 MED ORDER — SEMAGLUTIDE-WEIGHT MANAGEMENT 2.4 MG/0.75ML ~~LOC~~ SOAJ
2.4000 mg | SUBCUTANEOUS | 0 refills | Status: DC
  Filled 2021-06-09 – 2021-09-02 (×2): qty 3, 28d supply, fill #0

## 2021-06-09 MED ORDER — SEMAGLUTIDE-WEIGHT MANAGEMENT 1.7 MG/0.75ML ~~LOC~~ SOAJ
1.7000 mg | SUBCUTANEOUS | 0 refills | Status: DC
  Filled 2021-06-09 – 2021-07-28 (×2): qty 3, 28d supply, fill #0

## 2021-06-09 MED ORDER — SEMAGLUTIDE-WEIGHT MANAGEMENT 1 MG/0.5ML ~~LOC~~ SOAJ
1.0000 mg | SUBCUTANEOUS | 0 refills | Status: AC
  Filled 2021-06-09: qty 2, 28d supply, fill #0

## 2021-07-28 ENCOUNTER — Other Ambulatory Visit (HOSPITAL_COMMUNITY): Payer: Self-pay

## 2021-08-05 ENCOUNTER — Other Ambulatory Visit (HOSPITAL_COMMUNITY): Payer: Self-pay

## 2021-09-02 ENCOUNTER — Other Ambulatory Visit (HOSPITAL_COMMUNITY): Payer: Self-pay

## 2021-09-23 ENCOUNTER — Encounter: Payer: Self-pay | Admitting: Internal Medicine

## 2021-09-28 ENCOUNTER — Other Ambulatory Visit (HOSPITAL_COMMUNITY): Payer: Self-pay

## 2021-09-28 ENCOUNTER — Other Ambulatory Visit: Payer: Self-pay | Admitting: Internal Medicine

## 2021-09-30 ENCOUNTER — Other Ambulatory Visit (HOSPITAL_COMMUNITY): Payer: Self-pay

## 2021-09-30 ENCOUNTER — Other Ambulatory Visit: Payer: Self-pay

## 2021-10-01 ENCOUNTER — Other Ambulatory Visit (HOSPITAL_COMMUNITY): Payer: Self-pay

## 2021-10-05 ENCOUNTER — Other Ambulatory Visit (HOSPITAL_COMMUNITY): Payer: Self-pay

## 2021-10-05 MED ORDER — WEGOVY 2.4 MG/0.75ML ~~LOC~~ SOAJ
2.4000 mg | SUBCUTANEOUS | 0 refills | Status: AC
  Filled 2021-10-05 – 2021-10-21 (×4): qty 3, 28d supply, fill #0

## 2021-10-07 NOTE — Telephone Encounter (Signed)
Pt was advised that a PA is needed for [facility]  Pt is wanting to be updated when the PA is completed.   Please advise

## 2021-10-11 NOTE — Telephone Encounter (Signed)
PA has been started Key: [employer]

## 2021-10-13 ENCOUNTER — Other Ambulatory Visit (HOSPITAL_COMMUNITY): Payer: Self-pay

## 2021-10-18 ENCOUNTER — Other Ambulatory Visit (HOSPITAL_COMMUNITY): Payer: Self-pay

## 2021-10-18 NOTE — Telephone Encounter (Signed)
Patient is requesting status update on PA for [facility]. Call back number is [phone]

## 2021-10-21 ENCOUNTER — Other Ambulatory Visit (HOSPITAL_COMMUNITY): Payer: Self-pay

## 2021-10-22 ENCOUNTER — Telehealth: Payer: Self-pay | Admitting: *Deleted

## 2021-10-22 NOTE — Telephone Encounter (Signed)
[facility]- PA for Wegovy 2.4mg /0.57ml [place] SOAJ was denied.

## 2021-10-29 NOTE — Telephone Encounter (Signed)
Please advise 

## 2021-11-05 ENCOUNTER — Other Ambulatory Visit (HOSPITAL_COMMUNITY): Payer: Self-pay

## 2021-11-05 MED ORDER — WEGOVY 1.7 MG/0.75ML ~~LOC~~ SOAJ
1.7000 mg | SUBCUTANEOUS | 0 refills | Status: DC
  Filled 2021-11-05 – 2021-11-08 (×2): qty 3, 28d supply, fill #0

## 2021-11-08 ENCOUNTER — Other Ambulatory Visit (HOSPITAL_COMMUNITY): Payer: Self-pay

## 2021-12-06 ENCOUNTER — Other Ambulatory Visit (HOSPITAL_COMMUNITY): Payer: Self-pay

## 2021-12-06 MED ORDER — WEGOVY 2.4 MG/0.75ML ~~LOC~~ SOAJ
2.4000 mg | SUBCUTANEOUS | 2 refills | Status: DC
  Filled 2021-12-06 – 2021-12-15 (×2): qty 3, 28d supply, fill #0
  Filled 2022-01-06: qty 3, 28d supply, fill #1
  Filled 2022-02-03: qty 3, 28d supply, fill #2

## 2021-12-14 ENCOUNTER — Other Ambulatory Visit (HOSPITAL_COMMUNITY): Payer: Self-pay

## 2021-12-15 ENCOUNTER — Other Ambulatory Visit (HOSPITAL_COMMUNITY): Payer: Self-pay

## 2022-01-12 ENCOUNTER — Other Ambulatory Visit (HOSPITAL_COMMUNITY): Payer: Self-pay

## 2022-03-03 ENCOUNTER — Other Ambulatory Visit (HOSPITAL_COMMUNITY): Payer: Self-pay

## 2022-03-14 ENCOUNTER — Other Ambulatory Visit (HOSPITAL_COMMUNITY): Payer: Self-pay

## 2022-03-14 MED ORDER — WEGOVY 2.4 MG/0.75ML ~~LOC~~ SOAJ
2.4000 mg | SUBCUTANEOUS | 2 refills | Status: DC
  Filled 2022-03-14: qty 3, 28d supply, fill #0
  Filled 2022-03-30: qty 3, 25d supply, fill #1
  Filled 2022-04-03 – 2022-04-08 (×3): qty 3, 28d supply, fill #1
  Filled 2022-04-19 – 2022-05-06 (×2): qty 3, 28d supply, fill #2

## 2022-03-31 ENCOUNTER — Other Ambulatory Visit (HOSPITAL_COMMUNITY): Payer: Self-pay

## 2022-04-04 ENCOUNTER — Other Ambulatory Visit (HOSPITAL_COMMUNITY): Payer: Self-pay

## 2022-04-06 ENCOUNTER — Other Ambulatory Visit (HOSPITAL_COMMUNITY): Payer: Self-pay

## 2022-04-07 ENCOUNTER — Other Ambulatory Visit (HOSPITAL_COMMUNITY): Payer: Self-pay

## 2022-04-19 ENCOUNTER — Other Ambulatory Visit (HOSPITAL_COMMUNITY): Payer: Self-pay

## 2022-04-25 LAB — HM MAMMOGRAPHY

## 2022-05-06 ENCOUNTER — Other Ambulatory Visit (HOSPITAL_COMMUNITY): Payer: Self-pay

## 2022-05-13 ENCOUNTER — Other Ambulatory Visit (HOSPITAL_COMMUNITY): Payer: Self-pay

## 2022-07-10 ENCOUNTER — Other Ambulatory Visit: Payer: Self-pay | Admitting: Internal Medicine

## 2022-07-14 ENCOUNTER — Other Ambulatory Visit: Payer: Self-pay

## 2022-07-14 ENCOUNTER — Emergency Department (HOSPITAL_BASED_OUTPATIENT_CLINIC_OR_DEPARTMENT_OTHER)
Admission: EM | Admit: 2022-07-14 | Discharge: 2022-07-15 | Disposition: A | Discharge status: Home / Self Care | Payer: BC Managed Care – PPO | Attending: Emergency Medicine | Admitting: Emergency Medicine

## 2022-07-14 ENCOUNTER — Encounter (HOSPITAL_BASED_OUTPATIENT_CLINIC_OR_DEPARTMENT_OTHER): Payer: Self-pay

## 2022-07-14 ENCOUNTER — Emergency Department (HOSPITAL_BASED_OUTPATIENT_CLINIC_OR_DEPARTMENT_OTHER): Payer: BC Managed Care – PPO | Admitting: Radiology

## 2022-07-14 DIAGNOSIS — T7840XA Allergy, unspecified, initial encounter: Secondary | ICD-10-CM | POA: Diagnosis not present

## 2022-07-14 DIAGNOSIS — Z91040 Latex allergy status: Secondary | ICD-10-CM | POA: Diagnosis not present

## 2022-07-14 DIAGNOSIS — M7989 Other specified soft tissue disorders: Secondary | ICD-10-CM | POA: Diagnosis present

## 2022-07-14 LAB — CBC
HCT: 40.8 % (ref 36.0–46.0)
Hemoglobin: 13.1 g/dL (ref 12.0–15.0)
MCH: 30.2 pg (ref 26.0–34.0)
MCHC: 32.1 g/dL (ref 30.0–36.0)
MCV: 94 fL (ref 80.0–100.0)
Platelets: 188 10*3/uL (ref 150–400)
RBC: 4.34 MIL/uL (ref 3.87–5.11)
RDW: 12.9 % (ref 11.5–15.5)
WBC: 8.3 10*3/uL (ref 4.0–10.5)
nRBC: 0 % (ref 0.0–0.2)

## 2022-07-14 LAB — BASIC METABOLIC PANEL
Anion gap: 7 (ref 5–15)
BUN: 13 mg/dL (ref 6–20)
CO2: 27 mmol/L (ref 22–32)
Calcium: 9.2 mg/dL (ref 8.9–10.3)
Chloride: 106 mmol/L (ref 98–111)
Creatinine, Ser: 0.73 mg/dL (ref 0.44–1.00)
GFR, Estimated: >60 mL/min (ref >60)
Glucose, Bld: 125 mg/dL — ABNORMAL HIGH (ref 70–99)
Potassium: 4.2 mmol/L (ref 3.5–5.1)
Sodium: 140 mmol/L (ref 135–145)

## 2022-07-14 MED ORDER — FAMOTIDINE 20 MG PO TABS
20.0000 mg | ORAL_TABLET | Freq: Once | ORAL | Status: AC
  Administered 2022-07-14: 20 mg via ORAL
  Filled 2022-07-14: qty 1

## 2022-07-14 MED ORDER — PREDNISONE 50 MG PO TABS
60.0000 mg | ORAL_TABLET | Freq: Once | ORAL | Status: AC
  Administered 2022-07-14: 60 mg via ORAL
  Filled 2022-07-14: qty 1

## 2022-07-14 MED ORDER — DIPHENHYDRAMINE HCL 25 MG PO CAPS
25.0000 mg | ORAL_CAPSULE | Freq: Once | ORAL | Status: AC
  Administered 2022-07-14: 25 mg via ORAL
  Filled 2022-07-14: qty 1

## 2022-07-14 NOTE — ED Provider Notes (Signed)
[place] EMERGENCY DEPARTMENT AT [facility] Provider Note   CSN: [id number] Arrival date & time: 07/14/22  2200     History  Chief Complaint  Patient presents with   Hand Swelling    Emily Li is a 47 y.o. female.  Patient with a history of lupus as well as Raynaud's disease on chronic Plaquenil here with redness and swelling to her left hand and wrist.  Onset this morning when she woke up about 9 AM.  Denies any known injury or bite.  Redness started on her left thumb and radial hand and has since spread to involve her entire left hand and wrist area.  Having some itchiness and pain and swelling but no fever.  No drainage.  No focal weakness, numbness or tingling.  Took Tylenol and Zyrtec at home without relief.  Does not know if she had any kind of allergic reaction to a bug bite or sting.  Does have a known history of latex allergy but denies any new exposures.  No new medications, cleaning products, clothing, foods or environmental exposures.  No difficulty breathing or difficulty swallowing.  No chest pain or shortness of breath.  No tongue or lip swelling.  The history is provided by the patient.       Home Medications Prior to Admission medications   Medication Sig Start Date End Date Taking? Authorizing Provider  ALPRAZolam (XANAX) 0.25 MG tablet Take 1 tablet (0.25 mg total) by mouth 2 (two) times daily as needed. 11/28/17   Myrlene Broker, MD  DULoxetine (CYMBALTA) 60 MG capsule TAKE 1 CAPSULE (60 MG TOTAL) BY MOUTH DAILY. 05/25/21 05/25/22  Myrlene Broker, MD  hydroxychloroquine (PLAQUENIL) 200 MG tablet Take 2 tablets (400 mg total) by mouth daily. 05/25/21   Myrlene Broker, MD  Multiple Vitamin (MULTI-VITAMIN) tablet Take 1 tablet by mouth daily.    [provider]  Semaglutide-Weight Management (WEGOVY) 2.4 MG/0.75ML SOAJ Inject 2.4 mg into the skin once a week. 03/14/22         Allergies    Latex and Anectine  [succinylcholine chloride]    Review of Systems   Review of Systems  Constitutional:  Negative for activity change, appetite change and fever.  HENT:  Negative for congestion and rhinorrhea.   Respiratory:  Negative for cough, chest tightness and shortness of breath.   Cardiovascular:  Negative for chest pain.  Gastrointestinal:  Negative for abdominal pain, nausea and vomiting.  Genitourinary:  Negative for dysuria and hematuria.  Skin:  Positive for rash.  Neurological:  Negative for dizziness, weakness and headaches.   all other systems are negative except as noted in the HPI and PMH.    Physical Exam Updated Vital Signs BP (!) 141/85 (BP Location: Right Arm)   Pulse 92   Temp 97.6 F (36.4 C)   Resp 16   Ht 5\' 7"  (1.702 m)   Wt 88.9 kg   SpO2 100%   BMI 30.70 kg/m  Physical Exam Vitals and nursing note reviewed.  Constitutional:      General: She is not in acute distress.    Appearance: She is well-developed.  HENT:     Head: Normocephalic and atraumatic.     Mouth/Throat:     Pharynx: No oropharyngeal exudate.  Eyes:     Conjunctiva/sclera: Conjunctivae normal.     Pupils: Pupils are equal, round, and reactive to light.  Neck:     Comments: No meningismus. Cardiovascular:  Rate and Rhythm: Normal rate and regular rhythm.     Heart sounds: Normal heart sounds. No murmur heard. Pulmonary:     Effort: Pulmonary effort is normal. No respiratory distress.     Breath sounds: Normal breath sounds.  Abdominal:     Palpations: Abdomen is soft.     Tenderness: There is no abdominal tenderness. There is no guarding or rebound.  Musculoskeletal:        General: No tenderness.     Cervical back: Normal range of motion and neck supple.     Comments: Swelling and erythema to left dorsal hand as depicted.  Stops at second and third proximal phalanx.  Full range of motion of fingers and cardinal hand movements intact.  Intact radial pulse.  Skin:    General: Skin is  warm.     Findings: Rash present.  Neurological:     Mental Status: She is alert and oriented to person, place, and time.     Cranial Nerves: No cranial nerve deficit.     Motor: No abnormal muscle tone.     Coordination: Coordination normal.     Comments:  5/5 strength throughout. CN 2-12 intact.Equal grip strength.   Psychiatric:        Behavior: Behavior normal.        ED Results / Procedures / Treatments   Labs (all labs ordered are listed, but only abnormal results are displayed) Labs Reviewed  BASIC METABOLIC PANEL - Abnormal; Notable for the following components:      Result Value   Glucose, Bld 125 (*)    All other components within normal limits  CBC  SEDIMENTATION RATE  C-REACTIVE PROTEIN    EKG None  Radiology DG Finger Thumb Left  Result Date: 07/14/2022 CLINICAL DATA:  Pain and swelling. EXAM: LEFT THUMB 2+V COMPARISON:  None Available. FINDINGS: There is no evidence of fracture or dislocation. No erosive change. There is no evidence of arthropathy or other focal bone abnormality. Generalized soft tissue edema. No soft tissue gas or radiopaque foreign body. IMPRESSION: Generalized soft tissue edema. No osseous abnormality. Electronically Signed   By: Narda Rutherford M.D.   On: 07/14/2022 23:04    Procedures Procedures    Medications Ordered in ED Medications  predniSONE (DELTASONE) tablet 60 mg (60 mg Oral Given 07/14/22 2331)  diphenhydrAMINE (BENADRYL) capsule 25 mg (25 mg Oral Given 07/14/22 2331)  famotidine (PEPCID) tablet 20 mg (20 mg Oral Given 07/14/22 2331)    ED Course/ Medical Decision Making/ A&P                             Medical Decision Making Amount and/or Complexity of Data Reviewed Labs: ordered. Radiology: ordered and independent interpretation performed. Decision-making details documented in ED Course. ECG/medicine tests: ordered and independent interpretation performed. Decision-making details documented in ED  Course.  Risk Prescription drug management.   Left hand and wrist pain and swelling since this morning.  No direct trauma or injury.  Neurovascularly intact.  Concern for likely allergic reaction, possible insect bite or sting, versus rheumatological disorder.  Low suspicion for infection given acuity of presentation and lack of fever.  Triage x-rays negative for acute bony pathology.  Results reviewed and interpreted by me.  No leukocytosis.  No fever.  Given patient's rheumatological disorders, ESR and CRP are sent.  Redness is improved throughout ED stay.  Favor allergic reaction. Will give course of steroids and antihistamines.  Follow-up with PCP.  Return to the ED sooner with redness spreading up her arm, difficulty breathing, difficulty swallowing, tongue or lip swelling or other concerns .       Final Clinical Impression(s) / ED Diagnoses Final diagnoses:  Allergic reaction, initial encounter    Rx / DC Orders ED Discharge Orders     None         Ayo Guarino, Jeannett Senior, MD 07/15/22 [phone]

## 2022-07-14 NOTE — ED Triage Notes (Signed)
Patient here POV from Home.  Endorses Left Thumb Redness that began today at 0900. Since then the Hand has progressively became red and swollen. No Fevers. No N/V/D.  NAD Noted during Triage. A&Ox4. CGS 15. Ambulatory.

## 2022-07-15 LAB — SEDIMENTATION RATE: Sed Rate: 11 mm/hr (ref 0–22)

## 2022-07-15 LAB — C-REACTIVE PROTEIN: CRP: 0.6 mg/dL (ref <1.0)

## 2022-07-15 MED ORDER — FAMOTIDINE 20 MG PO TABS: 20.0000 mg | ORAL_TABLET | Freq: Every day | ORAL | 0 refills | Status: DC

## 2022-07-15 MED ORDER — PREDNISONE 50 MG PO TABS: ORAL_TABLET | ORAL | 0 refills | Status: DC

## 2022-07-15 NOTE — ED Notes (Signed)
Reviewed AVS/discharge instruction with patient. Time allotted for and all questions answered. Patient is agreeable for d/c and escorted to ed exit by staff.  

## 2022-07-15 NOTE — Discharge Instructions (Signed)
Take the steroids and antihistamines as prescribed.  You may use Benadryl as needed for itching but it may make you sleepy so do not take it if you are working or driving.  Follow-up with your doctor for recheck.  Return to the ED sooner if redness spreads up your arm, you develop difficulty breathing, chest pain, tongue or lip swelling or other concerns.

## 2022-07-18 ENCOUNTER — Telehealth: Payer: Self-pay

## 2022-07-18 NOTE — Transitions of Care (Post Inpatient/ED Visit) (Unsigned)
   07/18/2022  Name: Emily Li MRN: [id number] DOB: 21-Sep-1975  Today's TOC FU Call Status: Today's TOC FU Call Status:: Unsuccessul Call (1st Attempt) Unsuccessful Call (1st Attempt) Date: 07/18/22  Attempted to reach the patient regarding the most recent Inpatient/ED visit.  Follow Up Plan: Additional outreach attempts will be made to reach the patient to complete the Transitions of Care (Post Inpatient/ED visit) call.   Signature Karena Addison, LPN [facility] Nurse Health Advisor Direct Dial [phone]

## 2022-07-19 NOTE — Transitions of Care (Post Inpatient/ED Visit) (Signed)
   07/19/2022  Name: Emily Li MRN: [id number] DOB: 10/02/1975  Today's TOC FU Call Status: Today's TOC FU Call Status:: Unsuccessul Call (1st Attempt) Unsuccessful Call (1st Attempt) Date: 07/18/22  Transition Care Management Follow-up Telephone Call Date of Discharge: 07/15/22 Discharge Facility: [facility] ([facility]) Type of Discharge: Emergency Department Reason for ED Visit: Other: (allergy) How have you been since you were released from the hospital?: Better Any questions or concerns?: No  Items Reviewed: Did you receive and understand the discharge instructions provided?: Yes Medications obtained,verified, and reconciled?: Yes (Medications Reviewed) Any new allergies since your discharge?: No Dietary orders reviewed?: Yes Do you have support at home?: Yes People in Home: spouse  Medications Reviewed Today: Medications Reviewed Today     Reviewed by Karena Addison, LPN (Licensed Practical Nurse) on 07/19/22 at 1606  Med List Status: <None>   Medication Order Taking? Sig Documenting Provider Last Dose Status Informant  ALPRAZolam (XANAX) 0.25 MG tablet [id number] No Take 1 tablet (0.25 mg total) by mouth 2 (two) times daily as needed. Myrlene Broker, MD Taking Active Self           Med Note Tiburcio Pea, Marice Potter Sep 25, 2018  3:48 PM) On hand  DULoxetine (CYMBALTA) 60 MG capsule [id number]  TAKE 1 CAPSULE (60 MG TOTAL) BY MOUTH DAILY. Myrlene Broker, MD  Expired 05/25/22 2359   famotidine (PEPCID) 20 MG tablet [id number]  Take 1 tablet (20 mg total) by mouth daily. Rancour, Jeannett Senior, MD  Active   hydroxychloroquine (PLAQUENIL) 200 MG tablet [id number]  Take 2 tablets (400 mg total) by mouth daily. Myrlene Broker, MD  Active   Multiple Vitamin (MULTI-VITAMIN) tablet [id number] No Take 1 tablet by mouth daily. [provider] Taking Active   predniSONE (DELTASONE) 50 MG tablet [id number]  1 tablet PO daily Rancour, Stephen, MD  Active    Semaglutide-Weight Management [facility] 2.4 MG/0.75ML SOAJ [id number]  Inject 2.4 mg into the skin once a week.   Active             Home Care and Equipment/Supplies: Were Home Health Services Ordered?: NA Any new equipment or medical supplies ordered?: NA  Functional Questionnaire: Do you need assistance with bathing/showering or dressing?: No Do you need assistance with meal preparation?: No Do you need assistance with eating?: No Do you have difficulty maintaining continence: No Do you need assistance with getting out of bed/getting out of a chair/moving?: No Do you have difficulty managing or taking your medications?: No  Follow up appointments reviewed: PCP Follow-up appointment confirmed?: Yes Date of PCP follow-up appointment?: 07/26/22 Follow-up Provider: crawford Specialist [facility] Follow-up appointment confirmed?: NA Do you need transportation to your follow-up appointment?: No Do you understand care options if your condition(s) worsen?: Yes-patient verbalized understanding    SIGNATURE Karena Addison, LPN [facility] Nurse Health Advisor Direct Dial [phone]

## 2022-07-21 ENCOUNTER — Telehealth: Payer: Self-pay

## 2022-07-21 NOTE — Transitions of Care (Post Inpatient/ED Visit) (Signed)
   07/21/2022  Name: Emily Li MRN: [id number] DOB: February 01, 1975  Today's TOC FU Call Status:    Attempted to reach the patient regarding the most recent Inpatient/ED visit.  Follow Up Plan: Additional outreach attempts will be made to reach the patient to complete the Transitions of Care (Post Inpatient/ED visit) call.   Signature TB,CMA

## 2022-07-26 ENCOUNTER — Encounter: Payer: Self-pay | Admitting: Internal Medicine

## 2022-07-26 ENCOUNTER — Ambulatory Visit: Payer: BC Managed Care – PPO | Admitting: Internal Medicine

## 2022-07-26 VITALS — BP 100/80 | HR 78 | Temp 98.3°F | Ht 67.0 in | Wt 209.0 lb

## 2022-07-26 DIAGNOSIS — F419 Anxiety disorder, unspecified: Secondary | ICD-10-CM

## 2022-07-26 DIAGNOSIS — Z9884 Bariatric surgery status: Secondary | ICD-10-CM | POA: Diagnosis not present

## 2022-07-26 DIAGNOSIS — M329 Systemic lupus erythematosus, unspecified: Secondary | ICD-10-CM

## 2022-07-26 DIAGNOSIS — E669 Obesity, unspecified: Secondary | ICD-10-CM | POA: Diagnosis not present

## 2022-07-26 DIAGNOSIS — F32A Depression, unspecified: Secondary | ICD-10-CM

## 2022-07-26 DIAGNOSIS — Z0000 Encounter for general adult medical examination without abnormal findings: Secondary | ICD-10-CM | POA: Diagnosis not present

## 2022-07-26 MED ORDER — DULOXETINE HCL 60 MG PO CPEP: ORAL_CAPSULE | Freq: Every day | ORAL | 3 refills | Status: DC

## 2022-07-26 NOTE — Assessment & Plan Note (Signed)
Recent labs with surgery for nutritional status which were normal. Continue to monitor.

## 2022-07-26 NOTE — Progress Notes (Signed)
   Subjective:   Patient ID: Emily Li, female    DOB: 1975/09/09, 47 y.o.   MRN: [id number]  HPI The patient is here for physical. Also needs follow up on recent urgent care visit for allergic reaction.   PMH, [facility], social history reviewed and updated  Review of Systems  Constitutional: Negative.   HENT: Negative.    Eyes: Negative.   Respiratory:  Negative for cough, chest tightness and shortness of breath.   Cardiovascular:  Negative for chest pain, palpitations and leg swelling.  Gastrointestinal:  Negative for abdominal distention, abdominal pain, constipation, diarrhea, nausea and vomiting.  Musculoskeletal: Negative.   Skin: Negative.   Neurological: Negative.   Psychiatric/Behavioral: Negative.      Objective:  Physical Exam Constitutional:      Appearance: She is well-developed.  HENT:     Head: Normocephalic and atraumatic.  Cardiovascular:     Rate and Rhythm: Normal rate and regular rhythm.  Pulmonary:     Effort: Pulmonary effort is normal. No respiratory distress.     Breath sounds: Normal breath sounds. No wheezing or rales.  Abdominal:     General: Bowel sounds are normal. There is no distension.     Palpations: Abdomen is soft.     Tenderness: There is no abdominal tenderness. There is no rebound.  Musculoskeletal:     Cervical back: Normal range of motion.  Skin:    General: Skin is warm and dry.  Neurological:     Mental Status: She is alert and oriented to person, place, and time.     Coordination: Coordination normal.     Vitals:   07/26/22 0928  BP: 100/80  Pulse: 78  Temp: 98.3 F (36.8 C)  TempSrc: Oral  SpO2: 99%  Weight: 209 lb (94.8 kg)  Height: 5\' 7"  (1.702 m)    Assessment & Plan:

## 2022-07-26 NOTE — Assessment & Plan Note (Signed)
Flu shot yearly. Tetanus up to date. Colonoscopy due at 47 discussed cologuard. Mammogram up to date, pap smear due with gyn she will schedule. Counseled about sun safety and mole surveillance. Counseled about the dangers of distracted driving. Given 10 year screening recommendations.

## 2022-07-26 NOTE — Patient Instructions (Signed)
Make sure to see the ob/gyn and think about colon cancer screening once you turn 47

## 2022-07-26 NOTE — Assessment & Plan Note (Signed)
We discussed how this recent swelling and rash could have been a lupus flare or other allergic reaction. No clear trigger and no bite or change in lotion, foods, etc. She will monitor and if recurrence let us know. For now we wil keep plaquenil 400 mg daily. She is up to date on eye exam every 6 months.

## 2022-07-26 NOTE — Assessment & Plan Note (Signed)
Taking cymbalta 60 mg daily and overall stable symptoms. She will continue and refilled.

## 2022-07-26 NOTE — Assessment & Plan Note (Signed)
She is seeing weight management in 2-3 weeks at [facility]. She had gastric procedure 2020 and has lost well since then. She was taking wegovy but this is no longer covered. She is struggling with more food thoughts since being off this. She did lose about 50 pounds on wegovy which was very successful.

## 2023-08-13 ENCOUNTER — Other Ambulatory Visit: Payer: Self-pay | Admitting: Internal Medicine

## 2023-09-09 ENCOUNTER — Other Ambulatory Visit: Payer: Self-pay | Admitting: Internal Medicine

## 2023-12-04 ENCOUNTER — Ambulatory Visit: Admitting: Internal Medicine

## 2023-12-04 ENCOUNTER — Ambulatory Visit: Payer: Self-pay | Admitting: Internal Medicine

## 2023-12-04 ENCOUNTER — Encounter: Payer: Self-pay | Admitting: Internal Medicine

## 2023-12-04 VITALS — BP 110/70 | HR 84 | Temp 98.9°F | Ht 67.0 in | Wt 250.0 lb

## 2023-12-04 DIAGNOSIS — F419 Anxiety disorder, unspecified: Secondary | ICD-10-CM

## 2023-12-04 DIAGNOSIS — Z0000 Encounter for general adult medical examination without abnormal findings: Secondary | ICD-10-CM | POA: Diagnosis not present

## 2023-12-04 DIAGNOSIS — Z91030 Bee allergy status: Secondary | ICD-10-CM | POA: Diagnosis not present

## 2023-12-04 DIAGNOSIS — F32A Depression, unspecified: Secondary | ICD-10-CM

## 2023-12-04 LAB — COMPREHENSIVE METABOLIC PANEL WITH GFR
ALT: 12 U/L (ref 0–35)
AST: 14 U/L (ref 0–37)
Albumin: 3.9 g/dL (ref 3.5–5.2)
Alkaline Phosphatase: 49 U/L (ref 39–117)
BUN: 9 mg/dL (ref 6–23)
CO2: 27 meq/L (ref 19–32)
Calcium: 8.9 mg/dL (ref 8.4–10.5)
Chloride: 106 meq/L (ref 96–112)
Creatinine, Ser: 0.83 mg/dL (ref 0.40–1.20)
GFR: 84.74 mL/min (ref >60.00)
Glucose, Bld: 82 mg/dL (ref 70–99)
Potassium: 4.7 meq/L (ref 3.5–5.1)
Sodium: 139 meq/L (ref 135–145)
Total Bilirubin: 0.6 mg/dL (ref 0.2–1.2)
Total Protein: 6.7 g/dL (ref 6.0–8.3)

## 2023-12-04 LAB — CBC
HCT: 43 % (ref 36.0–46.0)
Hemoglobin: 14.3 g/dL (ref 12.0–15.0)
MCHC: 33.2 g/dL (ref 30.0–36.0)
MCV: 93.1 fl (ref 78.0–100.0)
Platelets: 207 K/uL (ref 150.0–400.0)
RBC: 4.63 Mil/uL (ref 3.87–5.11)
RDW: 13.5 % (ref 11.5–15.5)
WBC: 6.2 K/uL (ref 4.0–10.5)

## 2023-12-04 LAB — LIPID PANEL
Cholesterol: 204 mg/dL — ABNORMAL HIGH (ref 0–200)
HDL: 52.5 mg/dL (ref >39.00)
LDL Cholesterol: 122 mg/dL — ABNORMAL HIGH (ref 0–99)
NonHDL: 151.72
Total CHOL/HDL Ratio: 4
Triglycerides: 150 mg/dL — ABNORMAL HIGH (ref 0.0–149.0)
VLDL: 30 mg/dL (ref 0.0–40.0)

## 2023-12-04 LAB — HEMOGLOBIN A1C: Hgb A1c MFr Bld: 5.2 % (ref 4.6–6.5)

## 2023-12-04 LAB — VITAMIN B12: Vitamin B-12: 359 pg/mL (ref 211–911)

## 2023-12-04 LAB — VITAMIN D 25 HYDROXY (VIT D DEFICIENCY, FRACTURES): VITD: 27.96 ng/mL — ABNORMAL LOW (ref 30.00–100.00)

## 2023-12-04 MED ORDER — DULOXETINE HCL 60 MG PO CPEP: 60.0000 mg | ORAL_CAPSULE | Freq: Every day | ORAL | 3 refills | Status: AC

## 2023-12-04 MED ORDER — EPINEPHRINE 0.3 MG/0.3ML IJ SOAJ: 0.3000 mg | INTRAMUSCULAR | 1 refills | Status: AC | PRN

## 2023-12-04 NOTE — Progress Notes (Unsigned)
   Subjective:   Patient ID: Emily Li, female    DOB: [birth year], 48 y.o.   MRN: [id number]  The patient is here for physical. Pertinent topics discussed: Discussed the use of AI scribe software for clinical note transcription with the patient, who gave verbal consent to proceed.  History of Present Illness Emily Li is a 48 year old female who presents with emotional distress and weight management concerns.  She has been experiencing emotional distress for the past month, with daily episodes of crying without a clear trigger. She attributes some of her stress to her partner's prolonged unemployment, which has created financial and emotional burdens. She feels she has lost focus on self-care and is now ready to prioritize herself again.  She continues to take Cymbalta , 60 mg daily, but feels it may not be as effective as before. She experiences 'brain zaps' in the afternoon or evening, despite taking her medication consistently. She does not wish to increase the dose as she does not feel helpless or suicidal.  She has struggled with weight management, particularly after her insurance stopped covering Wegovy . She switched to a compounded version, which was less effective and expensive, and discontinued it after three to four months due to lack of efficacy.  She has started experiencing hot flashes, and her menstrual cycles remain regular. She has not paid much attention to the regularity of her periods but acknowledges the presence of hot flashes and mood swings.  Earlier this year, she had COVID-19 and experienced an allergic reaction to an unknown insect sting, resulting in hives and throat swelling. She managed the reaction with Benadryl  but acknowledges the need for an epinephrine auto-injector.  PMH, [facility], social history reviewed and updated  Review of Systems  Constitutional: Negative.   HENT: Negative.    Eyes: Negative.   Respiratory:  Negative for cough, chest tightness  and shortness of breath.   Cardiovascular:  Negative for chest pain, palpitations and leg swelling.  Gastrointestinal:  Negative for abdominal distention, abdominal pain, constipation, diarrhea, nausea and vomiting.  Musculoskeletal: Negative.   Skin: Negative.   Neurological: Negative.   Psychiatric/Behavioral:  Positive for decreased concentration and dysphoric mood.     Objective:  Physical Exam Constitutional:      Appearance: She is well-developed.  HENT:     Head: Normocephalic and atraumatic.  Cardiovascular:     Rate and Rhythm: Normal rate and regular rhythm.  Pulmonary:     Effort: Pulmonary effort is normal. No respiratory distress.     Breath sounds: Normal breath sounds. No wheezing or rales.  Abdominal:     General: Bowel sounds are normal. There is no distension.     Palpations: Abdomen is soft.     Tenderness: There is no abdominal tenderness.  Musculoskeletal:     Cervical back: Normal range of motion.  Skin:    General: Skin is warm and dry.  Neurological:     Mental Status: She is alert and oriented to person, place, and time.     Coordination: Coordination normal.     Vitals:   12/04/23 0850  BP: 110/70  Pulse: 84  Temp: 98.9 F (37.2 C)  TempSrc: Oral  SpO2: 99%  Weight: 250 lb (113.4 kg)  Height: 5' 7 (1.702 m)    Assessment & Plan:

## 2023-12-06 DIAGNOSIS — Z91030 Bee allergy status: Secondary | ICD-10-CM | POA: Insufficient documentation

## 2023-12-06 NOTE — Assessment & Plan Note (Signed)
 Flu shot up to date. Tetanus up to date. Colonoscopy referral to GI done. Mammogram up to date, pap smear needs done. Counseled about sun safety and mole surveillance. Counseled about the dangers of distracted driving. Given 10 year screening recommendations.

## 2023-12-06 NOTE — Assessment & Plan Note (Signed)
 Overall worse lately but is working to add back self care. Would like to continue cymbalta  60 mg daily as her depression is stable. She is working with getting a veterinary surgeon.

## 2023-12-06 NOTE — Assessment & Plan Note (Signed)
 Rx done for epi pen and counseled on administration and avoidance of insects.

## 2023-12-06 NOTE — Assessment & Plan Note (Signed)
 BMI 39 and complicated by anxiety/depression. She has s/p sleeve and is not currently taking anything for weight management currently.
